# Patient Record
Sex: Male | Born: 1935 | Race: White | Hispanic: No | Marital: Married | Smoking: Never smoker
Health system: Southern US, Community
[De-identification: ages and names within clinical notes are randomized; demographics above are authoritative.]

## PROBLEM LIST (undated history)

## (undated) DIAGNOSIS — I219 Acute myocardial infarction, unspecified: Secondary | ICD-10-CM

## (undated) DIAGNOSIS — R609 Edema, unspecified: Secondary | ICD-10-CM

## (undated) DIAGNOSIS — R011 Cardiac murmur, unspecified: Secondary | ICD-10-CM

## (undated) DIAGNOSIS — C801 Malignant (primary) neoplasm, unspecified: Secondary | ICD-10-CM

## (undated) DIAGNOSIS — I2510 Atherosclerotic heart disease of native coronary artery without angina pectoris: Secondary | ICD-10-CM

## (undated) DIAGNOSIS — I639 Cerebral infarction, unspecified: Secondary | ICD-10-CM

## (undated) DIAGNOSIS — I499 Cardiac arrhythmia, unspecified: Secondary | ICD-10-CM

## (undated) DIAGNOSIS — I4891 Unspecified atrial fibrillation: Secondary | ICD-10-CM

## (undated) DIAGNOSIS — I10 Essential (primary) hypertension: Secondary | ICD-10-CM

## (undated) HISTORY — PX: CORONARY ANGIOPLASTY: SHX604

## (undated) HISTORY — PX: COLON SURGERY: SHX602

---

## 2003-06-02 ENCOUNTER — Other Ambulatory Visit: Payer: Self-pay

## 2006-07-08 ENCOUNTER — Ambulatory Visit: Payer: Self-pay | Admitting: Unknown Physician Specialty

## 2008-05-09 ENCOUNTER — Inpatient Hospital Stay: Payer: Self-pay | Admitting: Internal Medicine

## 2008-05-16 ENCOUNTER — Ambulatory Visit: Payer: Self-pay | Admitting: Internal Medicine

## 2008-06-09 ENCOUNTER — Ambulatory Visit: Payer: Self-pay | Admitting: Internal Medicine

## 2008-06-16 ENCOUNTER — Ambulatory Visit: Payer: Self-pay | Admitting: Internal Medicine

## 2008-07-05 ENCOUNTER — Ambulatory Visit: Payer: Self-pay | Admitting: Surgery

## 2008-07-16 ENCOUNTER — Ambulatory Visit: Payer: Self-pay | Admitting: Internal Medicine

## 2008-08-16 ENCOUNTER — Ambulatory Visit: Payer: Self-pay | Admitting: Internal Medicine

## 2008-09-01 ENCOUNTER — Ambulatory Visit: Payer: Self-pay | Admitting: Surgery

## 2008-09-06 ENCOUNTER — Ambulatory Visit: Payer: Self-pay | Admitting: Surgery

## 2008-09-15 ENCOUNTER — Ambulatory Visit: Payer: Self-pay | Admitting: Internal Medicine

## 2008-10-16 ENCOUNTER — Ambulatory Visit: Payer: Self-pay | Admitting: Internal Medicine

## 2008-11-16 ENCOUNTER — Ambulatory Visit: Payer: Self-pay | Admitting: Internal Medicine

## 2008-12-16 ENCOUNTER — Ambulatory Visit: Payer: Self-pay | Admitting: Internal Medicine

## 2008-12-30 ENCOUNTER — Ambulatory Visit: Payer: Self-pay | Admitting: Surgery

## 2009-01-16 ENCOUNTER — Ambulatory Visit: Payer: Self-pay | Admitting: Internal Medicine

## 2009-02-15 ENCOUNTER — Ambulatory Visit: Payer: Self-pay | Admitting: Internal Medicine

## 2009-03-18 ENCOUNTER — Ambulatory Visit: Payer: Self-pay | Admitting: Internal Medicine

## 2009-04-18 ENCOUNTER — Ambulatory Visit: Payer: Self-pay | Admitting: Internal Medicine

## 2009-05-16 ENCOUNTER — Ambulatory Visit: Payer: Self-pay | Admitting: Internal Medicine

## 2009-06-13 ENCOUNTER — Ambulatory Visit: Payer: Self-pay | Admitting: Internal Medicine

## 2009-06-16 ENCOUNTER — Ambulatory Visit: Payer: Self-pay | Admitting: Internal Medicine

## 2009-07-16 ENCOUNTER — Ambulatory Visit: Payer: Self-pay | Admitting: Internal Medicine

## 2009-08-16 ENCOUNTER — Ambulatory Visit: Payer: Self-pay | Admitting: Internal Medicine

## 2009-09-15 ENCOUNTER — Ambulatory Visit: Payer: Self-pay | Admitting: Internal Medicine

## 2009-11-13 ENCOUNTER — Ambulatory Visit: Payer: Self-pay | Admitting: Internal Medicine

## 2009-11-16 ENCOUNTER — Ambulatory Visit: Payer: Self-pay | Admitting: Internal Medicine

## 2009-12-16 ENCOUNTER — Ambulatory Visit: Payer: Self-pay | Admitting: Internal Medicine

## 2009-12-18 ENCOUNTER — Ambulatory Visit: Payer: Self-pay | Admitting: Internal Medicine

## 2009-12-19 LAB — CEA: CEA: 2.5 ng/mL (ref 0.0–4.7)

## 2010-01-16 ENCOUNTER — Ambulatory Visit: Payer: Self-pay | Admitting: Internal Medicine

## 2010-02-15 ENCOUNTER — Ambulatory Visit: Payer: Self-pay | Admitting: Internal Medicine

## 2010-03-14 ENCOUNTER — Ambulatory Visit: Payer: Self-pay | Admitting: Internal Medicine

## 2010-03-16 LAB — CEA: CEA: 2.3 ng/mL (ref 0.0–4.7)

## 2010-03-18 ENCOUNTER — Ambulatory Visit: Payer: Self-pay | Admitting: Internal Medicine

## 2010-04-18 ENCOUNTER — Ambulatory Visit: Payer: Self-pay | Admitting: Internal Medicine

## 2010-05-17 ENCOUNTER — Ambulatory Visit: Payer: Self-pay | Admitting: Internal Medicine

## 2010-06-12 LAB — CEA: CEA: 3.1 ng/mL (ref 0.0–4.7)

## 2010-06-17 ENCOUNTER — Ambulatory Visit: Payer: Self-pay | Admitting: Internal Medicine

## 2010-09-10 ENCOUNTER — Ambulatory Visit: Payer: Self-pay | Admitting: Internal Medicine

## 2010-09-16 ENCOUNTER — Ambulatory Visit: Payer: Self-pay | Admitting: Internal Medicine

## 2011-01-11 ENCOUNTER — Ambulatory Visit: Payer: Self-pay | Admitting: Surgery

## 2011-01-14 ENCOUNTER — Ambulatory Visit: Payer: Self-pay | Admitting: Internal Medicine

## 2011-01-15 LAB — CEA: CEA: 2.9 ng/mL (ref 0.0–4.7)

## 2011-01-17 ENCOUNTER — Ambulatory Visit: Payer: Self-pay | Admitting: Internal Medicine

## 2011-05-16 ENCOUNTER — Ambulatory Visit: Payer: Self-pay | Admitting: Internal Medicine

## 2011-05-16 LAB — CREATININE, SERUM: EGFR (African American): >60

## 2011-05-17 ENCOUNTER — Ambulatory Visit: Payer: Self-pay | Admitting: Internal Medicine

## 2011-06-17 ENCOUNTER — Ambulatory Visit: Payer: Self-pay | Admitting: Internal Medicine

## 2012-07-16 DIAGNOSIS — I639 Cerebral infarction, unspecified: Secondary | ICD-10-CM | POA: Insufficient documentation

## 2012-08-03 ENCOUNTER — Inpatient Hospital Stay: Payer: Self-pay | Admitting: Cardiology

## 2012-08-03 LAB — PROTIME-INR
INR: 1.1
Prothrombin Time: 14.3 secs (ref 11.5–14.7)

## 2012-08-03 LAB — COMPREHENSIVE METABOLIC PANEL
Albumin: 3.5 g/dL (ref 3.4–5.0)
Anion Gap: 3 — ABNORMAL LOW (ref 7–16)
Bilirubin,Total: 0.6 mg/dL (ref 0.2–1.0)
Chloride: 107 mmol/L (ref 98–107)
Co2: 29 mmol/L (ref 21–32)
Creatinine: 0.84 mg/dL (ref 0.60–1.30)
EGFR (African American): >60
EGFR (Non-African Amer.): >60
Glucose: 77 mg/dL (ref 65–99)
Osmolality: 276 (ref 275–301)
SGOT(AST): 25 U/L (ref 15–37)
Sodium: 139 mmol/L (ref 136–145)
Total Protein: 7 g/dL (ref 6.4–8.2)

## 2012-08-03 LAB — CBC WITH DIFFERENTIAL/PLATELET
Basophil #: 0 10*3/uL (ref 0.0–0.1)
Basophil %: 0.6 %
Eosinophil #: 0.1 10*3/uL (ref 0.0–0.7)
Eosinophil %: 3.3 %
Lymphocyte %: 21.5 %
MCH: 34.3 pg — ABNORMAL HIGH (ref 26.0–34.0)
MCHC: 34.4 g/dL (ref 32.0–36.0)
Monocyte #: 0.6 x10 3/mm (ref 0.2–1.0)
Monocyte %: 12.9 %
Neutrophil #: 2.7 10*3/uL (ref 1.4–6.5)
Platelet: 110 10*3/uL — ABNORMAL LOW (ref 150–440)
WBC: 4.4 10*3/uL (ref 3.8–10.6)

## 2012-08-03 LAB — APTT: Activated PTT: 30.8 secs (ref 23.6–35.9)

## 2012-08-04 LAB — LIPID PANEL
Cholesterol: 162 mg/dL (ref 0–200)
HDL Cholesterol: 70 mg/dL — ABNORMAL HIGH (ref 40–60)
Ldl Cholesterol, Calc: 80 mg/dL (ref 0–100)
Triglycerides: 59 mg/dL (ref 0–200)
VLDL Cholesterol, Calc: 12 mg/dL (ref 5–40)

## 2012-08-04 LAB — BASIC METABOLIC PANEL
Calcium, Total: 8.2 mg/dL — ABNORMAL LOW (ref 8.5–10.1)
Chloride: 106 mmol/L (ref 98–107)
EGFR (African American): >60

## 2012-08-04 LAB — APTT: Activated PTT: 106.9 secs — ABNORMAL HIGH (ref 23.6–35.9)

## 2012-08-04 LAB — HEMOGLOBIN A1C: Hemoglobin A1C: 6 % (ref 4.2–6.3)

## 2012-08-05 LAB — CBC WITH DIFFERENTIAL/PLATELET
Basophil %: 0.8 %
Eosinophil #: 0.3 10*3/uL (ref 0.0–0.7)
Eosinophil %: 7.2 %
HCT: 39.1 % — ABNORMAL LOW (ref 40.0–52.0)
HGB: 13.5 g/dL (ref 13.0–18.0)
Lymphocyte #: 1.7 10*3/uL (ref 1.0–3.6)
Lymphocyte %: 36.1 %
MCH: 34.2 pg — ABNORMAL HIGH (ref 26.0–34.0)
MCHC: 34.5 g/dL (ref 32.0–36.0)
MCV: 99 fL (ref 80–100)
Monocyte #: 0.6 x10 3/mm (ref 0.2–1.0)
Monocyte %: 13.2 %
Neutrophil #: 2 10*3/uL (ref 1.4–6.5)
Neutrophil %: 42.7 %
RBC: 3.94 10*6/uL — ABNORMAL LOW (ref 4.40–5.90)
RDW: 13 % (ref 11.5–14.5)
WBC: 4.6 10*3/uL (ref 3.8–10.6)

## 2013-12-10 DIAGNOSIS — I4891 Unspecified atrial fibrillation: Secondary | ICD-10-CM | POA: Insufficient documentation

## 2013-12-10 DIAGNOSIS — E785 Hyperlipidemia, unspecified: Secondary | ICD-10-CM | POA: Insufficient documentation

## 2014-07-08 NOTE — Consult Note (Signed)
Referring Physician:  Teodoro Spray   Primary Care Physician:  Sharol Given, [address] [place], [place] [address], [phone]  Reason for Consult: Admit Date: 03-Aug-2012  Chief Complaint: altered mental status  Reason for Consult: CVA   History of Present Illness: History of Present Illness:   81 yo RHD M presents 2 days ago with altered mental status.  Pt was seen by cardiology and directly admitted.  Per family, pt had problems balancing his checkbook and saying certain words.  He feels better now.   ROS:  General denies complaints   HEENT no complaints   Lungs no complaints   Cardiac no complaints   GI no complaints   GU no complaints   Musculoskeletal no complaints   Extremities no complaints   Skin no complaints   Neuro no complaints   Endocrine no complaints   Psych no complaints   Past Medical/Surgical Hx:  colon ca:   prostate stones:   Past Medical/ Surgical Hx:  Past Medical History colon CA, afib   Past Surgical History none   Home Medications: Medication Instructions Last Modified Date/Time  aspirin 81 mg oral tablet 1 tab(s) orally once a day 19-May-14 10:45   Allergies:  No Known Allergies:   Allergies:  Allergies NKDA   Social/Family History: Employment Status: retired  Lives With: spouse  Living Arrangements: apartment  Social History: no tob, no illicits, no EtOH, retired Chief Financial Officer  Family History: no stroke or CAD   Vital Signs: **Vital Signs.:   20-May-14 15:18  Vital Signs Type Routine  Temperature Temperature (F) 97.9  Celsius 36.6  Temperature Source oral  Pulse Pulse 80  Respirations Respirations 20  Systolic BP Systolic BP 829  Diastolic BP (mmHg) Diastolic BP (mmHg) 77  Mean BP 89  Pulse Ox % Pulse Ox % 93  Pulse Ox Activity Level  At rest  Oxygen Delivery Room Air/ 21 %   Physical Exam: General: NAD, resting comfortable, appropiate weight  HEENT: normocephalic,  sclera nonicteric, oropharynx clear  Neck: supple, no JVD, no bruits  Chest: CTA B, no wheezing, good movement  Cardiac: irregularrly irregular, no murmur, +2 pulses  Extremities: no C/C/E, FROM   Neurologic Exam: Mental Status: alert and oriented x 3, normal speech and language, follows complex commands;  mild finger agnosia and acalculia  Cranial Nerves: PERRLA, EOMI, nl VF, face symmetric, tongue midline, shoulder shrug equal  Motor Exam: 5/5 B normal, tone, no tremor  Deep Tendon Reflexes: 2+/4 B, plantars downgoing B, no Hoffman  Sensory Exam: pinprick, temperature, and vibration intact B  Coordination: FTN and HTS WNL, nl RAM, nl gait   Lab Results: LabObservation:  20-May-14 15:12   OBSERVATION Reason for Test  Hepatic:  19-May-14 10:48   Bilirubin, Total 0.6  Alkaline Phosphatase 78  SGPT (ALT) 23  SGOT (AST) 25  Total Protein, Serum 7.0  Albumin, Serum 3.5  Routine Chem:  19-May-14 21:23   Result Comment Aptt Levester Fresh at 2154 08-03-12 JB.  - NOTIFIED OF CRITICAL VALUE  - READ-BACK PROCESS PERFORMED.  - RESULTS VERIFIED BY REPEAT TESTING.  Result(s) reported on 03 Aug 2012 at 09:56PM.  20-May-14 05:20   Glucose, Serum 91  BUN 14  Creatinine (comp) 0.94  Sodium, Serum 139  Potassium, Serum 4.2  Chloride, Serum 106  CO2, Serum 25  Calcium (Total), Serum  8.2  Anion Gap 8  Osmolality (calc) 278  eGFR (African American) >60  eGFR (  Non-African American) >60 (eGFR values <73m/min/1.73 m2 may be an indication of chronic kidney disease (CKD). Calculated eGFR is useful in patients with stable renal function. The eGFR calculation will not be reliable in acutely ill patients when serum creatinine is changing rapidly. It is not useful in  patients on dialysis. The eGFR calculation may not be applicable to patients at the low and high extremes of body sizes, pregnant women, and vegetarians.)  Cardiac:  19-May-14 10:48   Troponin I < 0.02 (0.00-0.05 0.05 ng/mL  or less: NEGATIVE  Repeat testing in 3-6 hrs  if clinically indicated. >0.05 ng/mL: POTENTIAL  MYOCARDIAL INJURY. Repeat  testing in 3-6 hrs if  clinically indicated. NOTE: An increase or decrease  of 30% or more on serial  testing suggests a  clinically important change)  Routine Coag:  19-May-14 10:48   Prothrombin 14.3  INR 1.1 (INR reference interval applies to patients on anticoagulant therapy. A single INR therapeutic range for coumarins is not optimal for all indications; however, the suggested range for most indications is 2.0 - 3.0. Exceptions to the INR Reference Range may include: Prosthetic heart valves, acute myocardial infarction, prevention of myocardial infarction, and combinations of aspirin and anticoagulant. The need for a higher or lower target INR must be assessed individually. Reference: The Pharmacology and Management of the Vitamin K  antagonists: the seventh ACCP Conference on Antithrombotic and Thrombolytic Therapy. CEHMCN.4709Sept:126 (3suppl): [id number] A HCT value >55% may artifactually increase the PT.  In one study,  the increase was an average of 25%. Reference:  "Effect on Routine and Special Coagulation Testing Values of Citrate Anticoagulant Adjustment in Patients with High HCT Values." American Journal of Clinical Pathology 2006;126:400-405.)  20-May-14 05:20   Activated PTT (APTT)  106.9 (A HCT value >55% may artifactually increase the APTT. In one study, the increase was an average of 19%. Reference: "Effect on Routine and Special Coagulation Testing Values of Citrate Anticoagulant Adjustment in Patients with High HCT Values." American Journal of Clinical Pathology 2006;126:400-405.)  Routine Hem:  19-May-14 10:48   WBC (CBC) 4.4  RBC (CBC)  4.32  Hemoglobin (CBC) 14.8  Hematocrit (CBC) 43.1  Platelet Count (CBC)  110  MCV 100  MCH  34.3  MCHC 34.4  RDW 13.2  Neutrophil % 61.7  Lymphocyte % 21.5  Monocyte % 12.9  Eosinophil %  3.3  Basophil % 0.6  Neutrophil # 2.7  Lymphocyte #  0.9  Monocyte # 0.6  Eosinophil # 0.1  Basophil # 0.0 (Result(s) reported on 03 Aug 2012 at 11:24AM.)   Radiology Results: [place]    19-May-14 11:55, [place] Doppler Bilateral  [place] Doppler Bilateral   REASON FOR EXAM:    tia  COMMENTS:       PROCEDURE: [place] - [place]OTID DOPPLER BILATERAL  - Aug 03 2012 11:55AM     RESULT: Comparison: None    Technique: Gray-scale, color Doppler, and spectral Doppler images were   obtained of the extracranial carotid artery systems and vertebral   arteries in the neck.    Findings:  Mild atherosclerotic plaque is demonstrated in the carotid bulbs and   internal carotid arteries. By visual inspection, there is less than 50%   stenosis. The peak systolic velocities are not elevated.  The vertebral arteries are patent bilaterally.    IMPRESSION:    No evidence of hemodynamically significant stenosis in the extracranial   carotid arteries.    Dictation site: 2  Verified By: Gregor Hams, M.D., MD  MRI:    20-May-14 14:10, MRI Brain Without Contrast  MRI Brain Without Contrast   REASON FOR EXAM:    tia  COMMENTS:       PROCEDURE: MR  - MR BRAIN WO CONTRAST  - Aug 04 2012  2:10PM     RESULT: History: TIA    Technique: Multiplanar, multisequence MRI of the brain was obtained   without IV contrast.     Comparison:  None    Findings:     There is restricted diffusion in the left parietal lobe with     corresponding ADC hypointensity consistent with an acute infarct. There   is no hemorrhage. There is no pathologic extra-axial fluid collection.   There is generalized cerebralatrophy. There is periventricular and deep   white matter T2 and FLAIR hyperintensity likely secondary to   microangiopathy. There is no hydrocephalus. The ventricles are normal for   age. The basal cisterns are patent. There are no abnormal extra-axial   fluid collections.     The visualized  paranasal sinuses and mastoid sinuses are clear. The skull   base and calvarium demonstrate normal signal. The major intracranial flow   voids, including dural venous sinuses appear patent.    IMPRESSION:     Acute nonhemorrhagic left parietal lobe infarct.  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 which explains problems with producing words and acalculia;  this is most likely secondary to atrial fibrillation Atrial fibrillation-  rate controlled ok to continue heparin since this is a small stroke would initiate either coumadin with goal INR 2-3 or newer anticoagulant tomorrow needs continued speech therapy no PT/OT needs carotids pending but most likely not source checking lipids, TSH, Hem A1, B12 will follow  Electronic Signatures: Jamison Neighbor (MD)  (Signed 20-May-14 18:44)  Authored: REFERRING PHYSICIAN, Primary Care Physician, Consult, History of Present Illness, Review of Systems, PAST MEDICAL/SURGICAL HISTORY, HOME MEDICATIONS, ALLERGIES, Social/Family History, NURSING VITAL SIGNS, Physical Exam-, LAB RESULTS, RADIOLOGY RESULTS, Recommendations   Last Updated: 20-May-14 18:44 by Jamison Neighbor (MD)

## 2014-07-08 NOTE — Discharge Summary (Signed)
Dates of Admission and Diagnosis:  Date of Admission 03-Aug-2012   Date of Discharge 05-Aug-2012   Admitting Diagnosis tia   Final Diagnosis left parietal cva   Discharge Diagnosis 1 left parietal cva   2 atrial fibrillation    Chief Complaint/History of Present Illness Pt presented to our office with complaints of speech difficulty as well as difficulty balancing his check book. He was non focal. He has been in afib chronically but had deferred anticoagulation or antiplatelet therapy.   Hospital Course:  Hospital Course Admitted and initially placed on heparin therapy. He underwent head ct and carotid dopplers both of which were unremarkable for any abnormality. He had a normal troponin. EKG revealed afib with variable ventricular response. MRI of brain revealed left parietal cva. Neurology consult completed with recommendation to add anticoagulation therapy after discharge. Will add riveroxiban in 1 week. Continue with low dose asa and metoprolol for afib rate control. No evidence of atherosclerosis. LDL 80. Likely etiology was from  afib.   Condition on Discharge Good   DISCHARGE INSTRUCTIONS HOME MEDS:  Medication Reconciliation: Patient's Home Medications at Discharge:     Medication Instructions  aspirin 81 mg oral tablet  1 tab(s) orally once a day   metoprolol succinate 25 mg oral tablet, extended release  1 tab(s) orally once a day    PRESCRIPTIONS: PRINTED AND PLACED ON CHART   Physician's Instructions:  Home Health? No   Treatments None   Home Oxygen? No   Diet Low Fat, Low Cholesterol   Dietary Supplements None   Diet Consistency Regular Consistency   Activity Limitations None   Return to Work Not Applicable   Time frame for Follow Up Appointment 1-2 weeks  Oaklen Thiam   Electronic Signatures: Teodoro Spray (MD)  (Signed 21-May-14 08:16)  Authored: ADMISSION DATE AND DIAGNOSIS, CHIEF COMPLAINT/HPI, HOSPITAL COURSE, DISCHARGE INSTRUCTIONS HOME MEDS,  PATIENT INSTRUCTIONS   Last Updated: 21-May-14 08:16 by Teodoro Spray (MD)

## 2014-08-06 ENCOUNTER — Other Ambulatory Visit (HOSPITAL_COMMUNITY): Payer: Self-pay

## 2014-08-06 ENCOUNTER — Inpatient Hospital Stay (HOSPITAL_COMMUNITY)
Admission: EM | Admit: 2014-08-06 | Discharge: 2014-08-09 | DRG: 247 | Disposition: A | Discharge status: Home / Self Care | Payer: Medicare Other | Attending: Cardiology | Admitting: Cardiology

## 2014-08-06 ENCOUNTER — Ambulatory Visit (HOSPITAL_COMMUNITY): Admit: 2014-08-06 | Payer: Self-pay | Admitting: Cardiology

## 2014-08-06 ENCOUNTER — Encounter (HOSPITAL_COMMUNITY): Payer: Self-pay | Admitting: Emergency Medicine

## 2014-08-06 ENCOUNTER — Encounter (HOSPITAL_COMMUNITY)
Admission: EM | Disposition: A | Discharge status: Home / Self Care | Payer: Self-pay | Source: Home / Self Care | Attending: Cardiology

## 2014-08-06 DIAGNOSIS — R0789 Other chest pain: Secondary | ICD-10-CM | POA: Diagnosis present

## 2014-08-06 DIAGNOSIS — I10 Essential (primary) hypertension: Secondary | ICD-10-CM | POA: Diagnosis present

## 2014-08-06 DIAGNOSIS — I482 Chronic atrial fibrillation: Secondary | ICD-10-CM | POA: Diagnosis present

## 2014-08-06 DIAGNOSIS — I2510 Atherosclerotic heart disease of native coronary artery without angina pectoris: Secondary | ICD-10-CM | POA: Diagnosis present

## 2014-08-06 DIAGNOSIS — I2111 ST elevation (STEMI) myocardial infarction involving right coronary artery: Secondary | ICD-10-CM | POA: Diagnosis present

## 2014-08-06 DIAGNOSIS — I2119 ST elevation (STEMI) myocardial infarction involving other coronary artery of inferior wall: Secondary | ICD-10-CM | POA: Diagnosis present

## 2014-08-06 DIAGNOSIS — E785 Hyperlipidemia, unspecified: Secondary | ICD-10-CM | POA: Diagnosis present

## 2014-08-06 DIAGNOSIS — I252 Old myocardial infarction: Secondary | ICD-10-CM

## 2014-08-06 DIAGNOSIS — D696 Thrombocytopenia, unspecified: Secondary | ICD-10-CM | POA: Diagnosis present

## 2014-08-06 DIAGNOSIS — Z8673 Personal history of transient ischemic attack (TIA), and cerebral infarction without residual deficits: Secondary | ICD-10-CM

## 2014-08-06 DIAGNOSIS — I213 ST elevation (STEMI) myocardial infarction of unspecified site: Secondary | ICD-10-CM

## 2014-08-06 HISTORY — DX: Essential (primary) hypertension: I10

## 2014-08-06 HISTORY — DX: Malignant (primary) neoplasm, unspecified: C80.1

## 2014-08-06 HISTORY — DX: Cerebral infarction, unspecified: I63.9

## 2014-08-06 HISTORY — PX: CARDIAC CATHETERIZATION: SHX172

## 2014-08-06 HISTORY — DX: Atherosclerotic heart disease of native coronary artery without angina pectoris: I25.10

## 2014-08-06 HISTORY — DX: Cardiac arrhythmia, unspecified: I49.9

## 2014-08-06 LAB — CBC WITH DIFFERENTIAL/PLATELET
BASOS PCT: 0 % (ref 0–1)
Basophils Absolute: 0 10*3/uL (ref 0.0–0.1)
EOS ABS: 0.1 10*3/uL (ref 0.0–0.7)
EOS PCT: 1 % (ref 0–5)
HEMATOCRIT: 43.1 % (ref 39.0–52.0)
HEMOGLOBIN: 14.5 g/dL (ref 13.0–17.0)
Lymphocytes Relative: 16 % (ref 12–46)
Lymphs Abs: 0.9 10*3/uL (ref 0.7–4.0)
MCH: 33.6 pg (ref 26.0–34.0)
MCHC: 33.6 g/dL (ref 30.0–36.0)
MCV: 100 fL (ref 78.0–100.0)
Monocytes Absolute: 0.4 10*3/uL (ref 0.1–1.0)
Monocytes Relative: 8 % (ref 3–12)
NEUTROS PCT: 76 % (ref 43–77)
Neutro Abs: 4.5 10*3/uL (ref 1.7–7.7)
PLATELETS: 103 10*3/uL — AB (ref 150–400)
RBC: 4.31 MIL/uL (ref 4.22–5.81)
RDW: 12.9 % (ref 11.5–15.5)
WBC: 5.9 10*3/uL (ref 4.0–10.5)

## 2014-08-06 LAB — TROPONIN I
TROPONIN I: 4.81 ng/mL — AB (ref <0.031)
TROPONIN I: 22.71 ng/mL — AB (ref <0.031)
Troponin I: 25.18 ng/mL (ref <0.031)

## 2014-08-06 LAB — CBC
HCT: 42.4 % (ref 39.0–52.0)
HEMOGLOBIN: 14.3 g/dL (ref 13.0–17.0)
MCH: 33.9 pg (ref 26.0–34.0)
MCHC: 33.7 g/dL (ref 30.0–36.0)
MCV: 100.5 fL — ABNORMAL HIGH (ref 78.0–100.0)
Platelets: 102 10*3/uL — ABNORMAL LOW (ref 150–400)
RBC: 4.22 MIL/uL (ref 4.22–5.81)
RDW: 12.9 % (ref 11.5–15.5)
WBC: 5.9 10*3/uL (ref 4.0–10.5)

## 2014-08-06 LAB — COMPREHENSIVE METABOLIC PANEL
ALBUMIN: 3.5 g/dL (ref 3.5–5.0)
ALT: 16 U/L — ABNORMAL LOW (ref 17–63)
ALT: 20 U/L (ref 17–63)
ANION GAP: 11 (ref 5–15)
ANION GAP: 9 (ref 5–15)
AST: 25 U/L (ref 15–41)
AST: 64 U/L — ABNORMAL HIGH (ref 15–41)
Albumin: 3.4 g/dL — ABNORMAL LOW (ref 3.5–5.0)
Alkaline Phosphatase: 60 U/L (ref 38–126)
Alkaline Phosphatase: 62 U/L (ref 38–126)
BUN: 11 mg/dL (ref 6–20)
BUN: 10 mg/dL (ref 6–20)
CALCIUM: 8.7 mg/dL — AB (ref 8.9–10.3)
CHLORIDE: 105 mmol/L (ref 101–111)
CO2: 23 mmol/L (ref 22–32)
CO2: 26 mmol/L (ref 22–32)
CREATININE: 0.86 mg/dL (ref 0.61–1.24)
Calcium: 8.6 mg/dL — ABNORMAL LOW (ref 8.9–10.3)
Chloride: 104 mmol/L (ref 101–111)
Creatinine, Ser: 0.85 mg/dL (ref 0.61–1.24)
GFR calc Af Amer: >60 mL/min (ref >60)
GFR calc Af Amer: >60 mL/min (ref >60)
GFR calc non Af Amer: >60 mL/min (ref >60)
Glucose, Bld: 124 mg/dL — ABNORMAL HIGH (ref 65–99)
Glucose, Bld: 114 mg/dL — ABNORMAL HIGH (ref 65–99)
Potassium: 3.8 mmol/L (ref 3.5–5.1)
Potassium: 4.4 mmol/L (ref 3.5–5.1)
SODIUM: 139 mmol/L (ref 135–145)
Sodium: 139 mmol/L (ref 135–145)
Total Bilirubin: 0.9 mg/dL (ref 0.3–1.2)
Total Bilirubin: 0.8 mg/dL (ref 0.3–1.2)
Total Protein: 6.1 g/dL — ABNORMAL LOW (ref 6.5–8.1)
Total Protein: 6.3 g/dL — ABNORMAL LOW (ref 6.5–8.1)

## 2014-08-06 LAB — PROTIME-INR
INR: 2.2 — ABNORMAL HIGH (ref 0.00–1.49)
Prothrombin Time: 24.2 seconds — ABNORMAL HIGH (ref 11.6–15.2)

## 2014-08-06 LAB — POCT I-STAT TROPONIN I: TROPONIN I, POC: 0 ng/mL (ref 0.00–0.08)

## 2014-08-06 LAB — APTT

## 2014-08-06 LAB — MRSA PCR SCREENING: MRSA BY PCR: NEGATIVE

## 2014-08-06 LAB — POCT ACTIVATED CLOTTING TIME: ACTIVATED CLOTTING TIME: 171 s

## 2014-08-06 SURGERY — LEFT HEART CATH AND CORONARY ANGIOGRAPHY
Anesthesia: LOCAL

## 2014-08-06 MED ORDER — TAMSULOSIN HCL 0.4 MG PO CAPS
0.4000 mg | ORAL_CAPSULE | Freq: Every day | ORAL | Status: DC
  Administered 2014-08-06 – 2014-08-09 (×4): 0.4 mg via ORAL
  Filled 2014-08-06 (×4): qty 1

## 2014-08-06 MED ORDER — BIVALIRUDIN BOLUS VIA INFUSION - CUPID
INTRAVENOUS | Status: DC | PRN
  Administered 2014-08-06: 55.125 mg via INTRAVENOUS

## 2014-08-06 MED ORDER — ATORVASTATIN CALCIUM 40 MG PO TABS
40.0000 mg | ORAL_TABLET | Freq: Every day | ORAL | Status: DC
  Administered 2014-08-06 – 2014-08-08 (×3): 40 mg via ORAL
  Filled 2014-08-06 (×4): qty 1

## 2014-08-06 MED ORDER — NITROGLYCERIN IN D5W 200-5 MCG/ML-% IV SOLN: 3.0000 ug/min | INTRAVENOUS | Status: DC

## 2014-08-06 MED ORDER — NITROGLYCERIN 1 MG/10 ML FOR IR/CATH LAB
INTRA_ARTERIAL | Status: AC
  Filled 2014-08-06: qty 10

## 2014-08-06 MED ORDER — ATROPINE SULFATE 0.1 MG/ML IJ SOLN
INTRAMUSCULAR | Status: DC | PRN
  Administered 2014-08-06 (×2): 0.5 mg via INTRAVENOUS

## 2014-08-06 MED ORDER — ONDANSETRON HCL 4 MG/2ML IJ SOLN
4.0000 mg | Freq: Four times a day (QID) | INTRAMUSCULAR | Status: DC | PRN
  Administered 2014-08-06 – 2014-08-08 (×3): 4 mg via INTRAVENOUS
  Filled 2014-08-06 (×3): qty 2

## 2014-08-06 MED ORDER — LIDOCAINE HCL (PF) 1 % IJ SOLN
INTRAMUSCULAR | Status: AC
  Filled 2014-08-06: qty 30

## 2014-08-06 MED ORDER — ACETAMINOPHEN 325 MG PO TABS: 650.0000 mg | ORAL_TABLET | ORAL | Status: DC | PRN

## 2014-08-06 MED ORDER — METOPROLOL TARTRATE 12.5 MG HALF TABLET
12.5000 mg | ORAL_TABLET | Freq: Two times a day (BID) | ORAL | Status: DC
  Administered 2014-08-06 – 2014-08-08 (×5): 12.5 mg via ORAL
  Filled 2014-08-06 (×6): qty 1

## 2014-08-06 MED ORDER — BIVALIRUDIN 250 MG IV SOLR
INTRAVENOUS | Status: AC
  Filled 2014-08-06: qty 250

## 2014-08-06 MED ORDER — HEPARIN SODIUM (PORCINE) 5000 UNIT/ML IJ SOLN
4000.0000 [IU] | INTRAMUSCULAR | Status: AC
  Administered 2014-08-06: 4000 [IU] via INTRAVENOUS
  Filled 2014-08-06: qty 1

## 2014-08-06 MED ORDER — ASPIRIN EC 81 MG PO TBEC
81.0000 mg | DELAYED_RELEASE_TABLET | Freq: Every day | ORAL | Status: DC
  Administered 2014-08-07 – 2014-08-09 (×3): 81 mg via ORAL
  Filled 2014-08-06 (×3): qty 1

## 2014-08-06 MED ORDER — TICAGRELOR 90 MG PO TABS
180.0000 mg | ORAL_TABLET | Freq: Once | ORAL | Status: AC
  Administered 2014-08-06: 180 mg via ORAL
  Filled 2014-08-06: qty 2

## 2014-08-06 MED ORDER — METOPROLOL TARTRATE 1 MG/ML IV SOLN
INTRAVENOUS | Status: AC
  Filled 2014-08-06: qty 5

## 2014-08-06 MED ORDER — ANGIOPLASTY BOOK
Freq: Once | Status: DC
  Filled 2014-08-06: qty 1

## 2014-08-06 MED ORDER — ACETAMINOPHEN 325 MG PO TABS
650.0000 mg | ORAL_TABLET | ORAL | Status: DC | PRN
  Administered 2014-08-06: 650 mg via ORAL
  Filled 2014-08-06: qty 2

## 2014-08-06 MED ORDER — SODIUM CHLORIDE 0.9 % IJ SOLN
3.0000 mL | Freq: Two times a day (BID) | INTRAMUSCULAR | Status: DC
  Administered 2014-08-06 (×2): 3 mL via INTRAVENOUS
  Administered 2014-08-07: 21:00:00 via INTRAVENOUS
  Administered 2014-08-07 – 2014-08-09 (×4): 3 mL via INTRAVENOUS

## 2014-08-06 MED ORDER — SODIUM CHLORIDE 0.9 % IV SOLN
250.0000 mg | INTRAVENOUS | Status: DC | PRN
  Administered 2014-08-06: 1.75 mg/kg/h via INTRAVENOUS

## 2014-08-06 MED ORDER — IOHEXOL 350 MG/ML SOLN
INTRAVENOUS | Status: DC | PRN
  Administered 2014-08-06: 115 mL via INTRAVENOUS

## 2014-08-06 MED ORDER — SODIUM CHLORIDE 0.9 % IJ SOLN: 3.0000 mL | INTRAMUSCULAR | Status: DC | PRN

## 2014-08-06 MED ORDER — ONDANSETRON HCL 4 MG/2ML IJ SOLN: 4.0000 mg | Freq: Four times a day (QID) | INTRAMUSCULAR | Status: DC | PRN

## 2014-08-06 MED ORDER — NITROGLYCERIN 0.4 MG SL SUBL: 0.4000 mg | SUBLINGUAL_TABLET | SUBLINGUAL | Status: DC | PRN

## 2014-08-06 MED ORDER — SODIUM CHLORIDE 0.9 % IV SOLN
INTRAVENOUS | Status: DC
  Administered 2014-08-06: 07:00:00 via INTRAVENOUS

## 2014-08-06 MED ORDER — SODIUM CHLORIDE 0.9 % IV SOLN: 250.0000 mL | INTRAVENOUS | Status: DC | PRN

## 2014-08-06 MED ORDER — ASPIRIN 81 MG PO CHEW: 324.0000 mg | CHEWABLE_TABLET | ORAL | Status: DC

## 2014-08-06 MED ORDER — TICAGRELOR 90 MG PO TABS
90.0000 mg | ORAL_TABLET | Freq: Two times a day (BID) | ORAL | Status: DC
  Filled 2014-08-06 (×2): qty 1

## 2014-08-06 MED ORDER — METOPROLOL TARTRATE 1 MG/ML IV SOLN
INTRAVENOUS | Status: DC | PRN
Start: 2014-08-06 — End: 2014-08-06
  Administered 2014-08-06: 2.5 mg via INTRAVENOUS

## 2014-08-06 MED ORDER — TICAGRELOR 90 MG PO TABS
90.0000 mg | ORAL_TABLET | Freq: Two times a day (BID) | ORAL | Status: DC
  Administered 2014-08-06 – 2014-08-08 (×4): 90 mg via ORAL
  Filled 2014-08-06 (×5): qty 1

## 2014-08-06 MED ORDER — PANTOPRAZOLE SODIUM 40 MG PO TBEC
40.0000 mg | DELAYED_RELEASE_TABLET | Freq: Every day | ORAL | Status: DC
  Administered 2014-08-06 – 2014-08-09 (×4): 40 mg via ORAL
  Filled 2014-08-06 (×3): qty 1

## 2014-08-06 MED ORDER — POTASSIUM CHLORIDE 10 MEQ/50ML IV SOLN: 10.0000 meq | INTRAVENOUS | Status: DC

## 2014-08-06 MED ORDER — HEPARIN (PORCINE) IN NACL 2-0.9 UNIT/ML-% IJ SOLN
INTRAMUSCULAR | Status: AC
  Filled 2014-08-06: qty 1500

## 2014-08-06 MED ORDER — ASPIRIN 300 MG RE SUPP
300.0000 mg | RECTAL | Status: DC
Start: 2014-08-06 — End: 2014-08-06

## 2014-08-06 MED ORDER — ASPIRIN 81 MG PO CHEW: 81.0000 mg | CHEWABLE_TABLET | Freq: Every day | ORAL | Status: DC

## 2014-08-06 MED ORDER — SODIUM CHLORIDE 0.9 % IV SOLN
INTRAVENOUS | Status: AC
  Administered 2014-08-06: 150 mL/h via INTRAVENOUS

## 2014-08-06 MED ORDER — ATROPINE SULFATE 0.1 MG/ML IJ SOLN
INTRAMUSCULAR | Status: AC
  Filled 2014-08-06: qty 10

## 2014-08-06 SURGICAL SUPPLY — 17 items
BALLN EMERGE MR 2.5X15 (BALLOONS) ×2
BALLN ~~LOC~~ EMERGE MR 3.75X15 (BALLOONS) ×2
BALLOON EMERGE MR 2.5X15 (BALLOONS) ×1 IMPLANT
BALLOON ~~LOC~~ EMERGE MR 3.75X15 (BALLOONS) ×1 IMPLANT
CATH INFINITI 5FR MULTPACK ANG (CATHETERS) ×2 IMPLANT
GUIDE CATH RUNWAY 6FR FR4 (CATHETERS) ×2 IMPLANT
KIT ENCORE 26 ADVANTAGE (KITS) ×2 IMPLANT
KIT HEART LEFT (KITS) ×2 IMPLANT
PACK CARDIAC CATHETERIZATION (CUSTOM PROCEDURE TRAY) ×2 IMPLANT
SHEATH PINNACLE 5F 10CM (SHEATH) ×2 IMPLANT
SHEATH PINNACLE 6F 10CM (SHEATH) ×2 IMPLANT
STENT XIENCE ALPINE RX 3.5X23 (Permanent Stent) ×2 IMPLANT
SYR MEDRAD MARK V 150ML (SYRINGE) ×2 IMPLANT
TRANSDUCER W/STOPCOCK (MISCELLANEOUS) ×2 IMPLANT
TUBING CIL FLEX 10 FLL-RA (TUBING) ×2 IMPLANT
WIRE EMERALD 3MM-J .035X150CM (WIRE) ×2 IMPLANT
WIRE PT2 MS 185 (WIRE) ×2 IMPLANT

## 2014-08-06 NOTE — Progress Notes (Addendum)
CRITICAL VALUE ALERT  Critical value received:  TROPONIN I 4.81 and PTT >200   Date of notification:  08/06/14  Time of notification:  1130  Critical value read back:Yes.    Nurse who received alert:  Sula Soda  MD notified (1st page):  Harwani  Time of first page:  21  MD notified (2nd page):Harwani  Time of second page: 1140  Responding MD:  Terrence Dupont  Time MD responded:  [address] [place]

## 2014-08-06 NOTE — Progress Notes (Signed)
CRITICAL VALUE ALERT  Critical value received:  Troponin 25.18  Date of notification:  No notification given by lab  Time of notification:  na  Critical value read back:No.  Nurse who received alert: self alerted  MD notified (1st page):  Dr Terrence Dupont  Time of first page:  [phone]  MD notified (2nd page):  Time of second page:  Responding MD:  Dr Terrence Dupont  Time MD responded:  [phone]

## 2014-08-06 NOTE — H&P (Signed)
Dustin Acosta. is an 81 y.o. male.   Chief Complaint: Chest pain associated with shortness of breath HPI: Patient is 81 year old male with past medical history significant for coronary artery disease history of MI many years ago per patient, hypertension, history of left parietal CVA, chronic atrial fibrillation on no anticoagulants patient refused in the past, chronic thrombocytopenia, came to the ER by EMS as code STEMI was called. Patient woke up with retrosternal chest pain described as pressure associated with mild shortness of breath around 5 AM received aspirin and sublingual nitroglycerin with partial relief. Chest pain was localized. Denies any nausea vomiting diaphoresis. Denies palpitation lightheadedness or syncope. EKG done showed atrial fibrillation with moderate ventricular response with ST elevation in inferior leads and ST depression in lead V1 and V2 with rest protocol changes in lead 1 and aVL suggestive of acute inferoposterior injury. Patient denies such episodes of recent chest pain states he is very active  Past Medical History  Diagnosis Date  . Coronary artery disease   . Hypertension     History reviewed. No pertinent past surgical history.  No family history on file. Social History:  reports that he has never smoked. He does not have any smokeless tobacco history on file. He reports that he does not drink alcohol or use illicit drugs.  Allergies: No Known Allergies  No prescriptions prior to admission    Results for orders placed or performed during the hospital encounter of 08/06/14 (from the past 48 hour(s))  CBC     Status: Abnormal   Collection Time: 08/06/14  6:44 AM  Result Value Ref Range   WBC 5.9 4.0 - 10.5 K/uL   RBC 4.22 4.22 - 5.81 MIL/uL   Hemoglobin 14.3 13.0 - 17.0 g/dL   HCT 42.4 39.0 - 52.0 %   MCV 100.5 (H) 78.0 - 100.0 fL   MCH 33.9 26.0 - 34.0 pg   MCHC 33.7 30.0 - 36.0 g/dL   RDW 12.9 11.5 - 15.5 %   Platelets 102 (L) 150 - 400  K/uL  POCT i-Stat troponin I     Status: None   Collection Time: 08/06/14  6:47 AM  Result Value Ref Range   Troponin i, poc 0.00 0.00 - 0.08 ng/mL   Comment 3            Comment: Due to the release kinetics of cTnI, a negative result within the first hours of the onset of symptoms does not rule out myocardial infarction with certainty. If myocardial infarction is still suspected, repeat the test at appropriate intervals.    No results found.  Review of Systems  Constitutional: Negative for fever and chills.  Eyes: Negative for double vision.  Respiratory: Positive for shortness of breath.   Cardiovascular: Positive for chest pain. Negative for palpitations, orthopnea, claudication and leg swelling.  Gastrointestinal: Negative for nausea, vomiting and abdominal pain.  Genitourinary: Negative for dysuria.  Neurological: Negative for dizziness and headaches.    Blood pressure 124/91, pulse 96, temperature 97.3 F (36.3 C), temperature source Oral, resp. rate 23, height 5\' 9"  (1.753 m), weight 73.483 kg (162 lb), SpO2 100 %. Physical Exam  Constitutional: He is oriented to person, place, and time.  HENT:  Head: Atraumatic.  Eyes: Conjunctivae are normal. Left eye exhibits no discharge. No scleral icterus.  Neck: Normal range of motion. Neck supple. No JVD present. No thyromegaly present.  Cardiovascular:  Irregularly irregular S1 and S2 soft soft S4 gallop noted  Respiratory:  Effort normal and breath sounds normal. No respiratory distress. He has no wheezes.  GI: Soft. Bowel sounds are normal. He exhibits no distension. There is no tenderness.  Musculoskeletal: He exhibits no edema or tenderness.  Neurological: He is alert and oriented to person, place, and time.     Assessment/Plan Acute inferoposterior wall myocardial infarction Coronary artery disease Hypertension Chronic atrial fibrillation History of left parietal CVA Chronic thrombocytopenia Plan Discussed with  patient regarding emergency left cath possible PTCA stenting its risk and benefits i.e. death MI stroke need for emergency CABG local vascular complications etc. and consented for PCI  Charolette Forward 08/06/2014, 8:04 AM

## 2014-08-06 NOTE — Progress Notes (Signed)
Pt educated on procedure, explained need to lie flat after removal d/t possible bleeding risk. Atropine at bedside. R femoral sheath pulled per protocol. Manual pressure held x15 min. After hemostasis, ble equally warm, pink, dry, cap refill <3 secs, pulses 2+. Dressing placed. Pt educated to put pressure on site for coughing, laughing, sneezing. Reported understanding. VSS throughout. RN notified.

## 2014-08-06 NOTE — ED Provider Notes (Signed)
CSN: [id number]     Arrival date & time 08/06/14  3335 History   First MD Initiated Contact with Patient 08/06/14 [phone]     Chief Complaint  Patient presents with  . Chest Pain     (Consider location/radiation/quality/duration/timing/severity/associated sxs/prior Treatment) HPI  This is an 81 year old male with history of coronary artery disease and hypertension who presents with chest pain. Onset of chest pain and shortness of breath approximate 5 AM. Took 4 baby aspirin and 1 sublingual nitroglycerin prior to EMS arrival. EMS EKG showed inferior ST elevation with anterior depressions. Code STEMI activated prior to patient arrival.  Upon arrival, patient awake, alert, and oriented. 4 out of 10 chest pain. Minimal improvement with nitroglycerin. Vital signs stable in route.  Level 5 caveat  Past Medical History  Diagnosis Date  . Coronary artery disease   . Hypertension    History reviewed. No pertinent past surgical history. No family history on file. History  Substance Use Topics  . Smoking status: Never Smoker   . Smokeless tobacco: Not on file  . Alcohol Use: No    Review of Systems  Unable to perform ROS: Acuity of condition  Constitutional: Negative.  Negative for fever.  Respiratory: Positive for chest tightness and shortness of breath.   Cardiovascular: Positive for chest pain. Negative for leg swelling.      Allergies  Review of patient's allergies indicates no known allergies.  Home Medications   Prior to Admission medications   Not on File   BP 124/91 mmHg  Pulse 96  Temp(Src) 97.3 F (36.3 C) (Oral)  Resp 23  Ht 5\' 9"  (1.753 m)  Wt 162 lb (73.483 kg)  BMI 23.91 kg/m2  SpO2 100% Physical Exam  Constitutional: He is oriented to person, place, and time. He appears well-developed and well-nourished. No distress.  Elderly  HENT:  Head: Normocephalic and atraumatic.  Cardiovascular: Normal rate, regular rhythm and normal heart sounds.   No murmur  heard. Pulmonary/Chest: Effort normal and breath sounds normal. No respiratory distress. He has no wheezes.  Abdominal: Soft. There is no tenderness.  Musculoskeletal: He exhibits no edema.  Neurological: He is alert and oriented to person, place, and time.  Skin: Skin is warm and dry.  Psychiatric: He has a normal mood and affect.  Nursing note and vitals reviewed.   ED Course  Procedures (including critical care time)  CRITICAL CARE Performed by: Merryl Hacker   Total critical care time: 35 min  Critical care time was exclusive of separately billable procedures and treating other patients.  Critical care was necessary to treat or prevent imminent or life-threatening deterioration.  Critical care was time spent personally by me on the following activities: development of treatment plan with patient and/or surrogate as well as nursing, discussions with consultants, evaluation of patient's response to treatment, examination of patient, obtaining history from patient or surrogate, ordering and performing treatments and interventions, ordering and review of laboratory studies, ordering and review of radiographic studies, pulse oximetry and re-evaluation of patient's condition.  Labs Review Labs Reviewed  CBC - Abnormal; Notable for the following:    MCV 100.5 (*)    Platelets 102 (*)    All other components within normal limits  APTT  COMPREHENSIVE METABOLIC PANEL  PROTIME-INR  I-STAT TROPOININ, ED  POCT I-STAT TROPONIN I    Imaging Review No results found.   EKG Interpretation EKG shows atrial fibrillation with ST elevations inferiorly and depressions anteriorly concerning for ST elevation MI  MDM   Final diagnoses:  ST elevation myocardial infarction (STEMI), unspecified artery     Patient presents as a code STEMI. Confirmed on EKG. Discussed with Dr. Terrence Dupont. Patient given Brilinta and heparin bolus. Heparin drip ordered. Basic labwork ordered.   Nitroglycerin held given changes anteriorly.  Patient taken emergently to the Cath Lab.    Merryl Hacker, MD 08/06/14 [phone]

## 2014-08-06 NOTE — ED Notes (Signed)
Pt. arrived with EMS from home reports central chest pain with SOB onset 5 am this morning , received fentanyl 75 mcg IV ,  4 baby ASA and 1 NTG sl spray with mild relief prior to arrival . Denies emesis or diaphoresis .

## 2014-08-07 LAB — LIPID PANEL
Cholesterol: 180 mg/dL (ref 0–200)
HDL: 66 mg/dL (ref >40)
LDL CALC: 101 mg/dL — AB (ref 0–99)
Total CHOL/HDL Ratio: 2.7 RATIO
Triglycerides: 63 mg/dL (ref <150)
VLDL: 13 mg/dL (ref 0–40)

## 2014-08-07 LAB — CBC
HCT: 40 % (ref 39.0–52.0)
Hemoglobin: 13.2 g/dL (ref 13.0–17.0)
MCH: 32.9 pg (ref 26.0–34.0)
MCHC: 33 g/dL (ref 30.0–36.0)
MCV: 99.8 fL (ref 78.0–100.0)
PLATELETS: 97 10*3/uL — AB (ref 150–400)
RBC: 4.01 MIL/uL — AB (ref 4.22–5.81)
RDW: 12.9 % (ref 11.5–15.5)
WBC: 5.6 10*3/uL (ref 4.0–10.5)

## 2014-08-07 LAB — BASIC METABOLIC PANEL
ANION GAP: 9 (ref 5–15)
BUN: 13 mg/dL (ref 6–20)
CO2: 22 mmol/L (ref 22–32)
Calcium: 8.4 mg/dL — ABNORMAL LOW (ref 8.9–10.3)
Chloride: 105 mmol/L (ref 101–111)
Creatinine, Ser: 0.84 mg/dL (ref 0.61–1.24)
GFR calc Af Amer: >60 mL/min (ref >60)
GFR calc non Af Amer: >60 mL/min (ref >60)
Glucose, Bld: 103 mg/dL — ABNORMAL HIGH (ref 65–99)
Potassium: 4.1 mmol/L (ref 3.5–5.1)
SODIUM: 136 mmol/L (ref 135–145)

## 2014-08-07 NOTE — Progress Notes (Signed)
Subjective:  Doing well denies any chest pain or shortness of breath. Complains of occasional GERD symptoms. Remains in atrial fibrillation with controlled ventricular response  Objective:  Vital Signs in the last 24 hours: Temp:  [97.2 F (36.2 C)-97.8 F (36.6 C)] 97.8 F (36.6 C) (05/22 0808) Pulse Rate:  [45-150] 83 (05/22 1000) Resp:  [13-30] 20 (05/22 1000) BP: (88-138)/(56-101) 117/101 mmHg (05/22 1000) SpO2:  [88 %-100 %] 98 % (05/22 1000)  Intake/Output from previous day: 05/21 0701 - 05/22 0700 In: 1124.2 [P.O.:360; I.V.:764.2] Out: 1050 [Urine:1050] Intake/Output from this shift:    Physical Exam: Neck: no adenopathy, no carotid bruit, no JVD and supple, symmetrical, trachea midline Lungs: clear to auscultation bilaterally Heart: irregularly irregular rhythm, S1, S2 normal and Soft systolic murmur noted no S3 gallop Abdomen: soft, non-tender; bowel sounds normal; no masses,  no organomegaly Extremities: extremities normal, atraumatic, no cyanosis or edema and Right groin stable  Lab Results:  Recent Labs  08/06/14 1002 08/07/14 0256  WBC 5.9 5.6  HGB 14.5 13.2  PLT 103* 97*    Recent Labs  08/06/14 1002 08/07/14 0256  NA 139 136  K 4.4 4.1  CL 104 105  CO2 26 22  GLUCOSE 114* 103*  BUN 10 13  CREATININE 0.86 0.84    Recent Labs  08/06/14 1611 08/06/14 2055  TROPONINI 25.18* 22.71*   Hepatic Function Panel  Recent Labs  08/06/14 1002  PROT 6.3*  ALBUMIN 3.5  AST 64*  ALT 20  ALKPHOS 62  BILITOT 0.8    Recent Labs  08/07/14 0256  CHOL 180   No results for input(s): PROTIME in the last 72 hours.  Imaging: Imaging results have been reviewed and No results found.  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  LOS: 1 day    Charolette Forward 08/07/2014, 12:03 PM

## 2014-08-07 NOTE — Progress Notes (Signed)
Pt ambulated to 2W37 with front wheel walker on monitor without event. Daughter, Hassan Rowan, phoned to make aware of room change. Pt's pajamas were transferred with him along with his glasses. RN Gae Bon in room for tele hook up and bedside report.   Lorre Munroe

## 2014-08-07 NOTE — Progress Notes (Addendum)
Pt transferred to 2W37 from 2S. Report received and pt family made aware of transfer. VSS . Pt denies pain. Pt oriented to room. Bed low, callbell in place. Will continue to monitor.  Raliegh Ip RN

## 2014-08-08 ENCOUNTER — Encounter (HOSPITAL_COMMUNITY): Payer: Self-pay | Admitting: Cardiology

## 2014-08-08 LAB — BASIC METABOLIC PANEL
Anion gap: 7 (ref 5–15)
BUN: 11 mg/dL (ref 6–20)
CHLORIDE: 107 mmol/L (ref 101–111)
CO2: 24 mmol/L (ref 22–32)
Calcium: 8.4 mg/dL — ABNORMAL LOW (ref 8.9–10.3)
Creatinine, Ser: 0.88 mg/dL (ref 0.61–1.24)
GFR calc Af Amer: >60 mL/min (ref >60)
GFR calc non Af Amer: >60 mL/min (ref >60)
Glucose, Bld: 98 mg/dL (ref 65–99)
Potassium: 3.8 mmol/L (ref 3.5–5.1)
Sodium: 138 mmol/L (ref 135–145)

## 2014-08-08 LAB — CBC
HCT: 38.3 % — ABNORMAL LOW (ref 39.0–52.0)
HEMOGLOBIN: 12.7 g/dL — AB (ref 13.0–17.0)
MCH: 32.6 pg (ref 26.0–34.0)
MCHC: 33.2 g/dL (ref 30.0–36.0)
MCV: 98.5 fL (ref 78.0–100.0)
Platelets: 94 10*3/uL — ABNORMAL LOW (ref 150–400)
RBC: 3.89 MIL/uL — ABNORMAL LOW (ref 4.22–5.81)
RDW: 13 % (ref 11.5–15.5)
WBC: 4 10*3/uL (ref 4.0–10.5)

## 2014-08-08 LAB — TROPONIN I: Troponin I: 8.24 ng/mL (ref <0.031)

## 2014-08-08 LAB — POCT ACTIVATED CLOTTING TIME: ACTIVATED CLOTTING TIME: 564 s

## 2014-08-08 MED ORDER — METOPROLOL TARTRATE 25 MG PO TABS
25.0000 mg | ORAL_TABLET | Freq: Two times a day (BID) | ORAL | Status: DC
  Administered 2014-08-08 – 2014-08-09 (×2): 25 mg via ORAL
  Filled 2014-08-08 (×3): qty 1

## 2014-08-08 MED ORDER — CLOPIDOGREL BISULFATE 75 MG PO TABS
75.0000 mg | ORAL_TABLET | Freq: Every day | ORAL | Status: DC
  Administered 2014-08-08 – 2014-08-09 (×2): 75 mg via ORAL
  Filled 2014-08-08 (×2): qty 1

## 2014-08-08 MED FILL — Heparin Sodium (Porcine) 2 Unit/ML in Sodium Chloride 0.9%: INTRAMUSCULAR | Qty: 1500 | Status: AC

## 2014-08-08 MED FILL — Lidocaine HCl Local Preservative Free (PF) Inj 1%: INTRAMUSCULAR | Qty: 30 | Status: AC

## 2014-08-08 NOTE — Progress Notes (Signed)
Utilization review complete. Kwinton Maahs RN CCM Case Mgmt phone [phone] 

## 2014-08-08 NOTE — Progress Notes (Signed)
Subjective:  Denies any chest pain or shortness of breath. Occasional episode of A. fib with RVR with minimal ambulation.  Objective:  Vital Signs in the last 24 hours: Temp:  [97.3 F (36.3 C)-98.4 F (36.9 C)] 98.4 F (36.9 C) (05/23 0440) Pulse Rate:  [53-98] 87 (05/23 0440) Resp:  [17-28] 18 (05/23 0440) BP: (84-126)/(45-75) 93/45 mmHg (05/23 0440) SpO2:  [92 %-98 %] 95 % (05/23 0440)  Intake/Output from previous day: 05/22 0701 - 05/23 0700 In: 123 [P.O.:120; I.V.:3] Out: 1600 [Urine:1600] Intake/Output from this shift: Total I/O In: 480 [P.O.:480] Out: -   Physical Exam: Neck: no adenopathy, no carotid bruit, no JVD and supple, symmetrical, trachea midline Lungs: clear to auscultation bilaterally Heart: irregularly irregular rhythm, S1, S2 normal and Soft systolic murmur noted no S3 gallop Abdomen: soft, non-tender; bowel sounds normal; no masses,  no organomegaly Extremities: extremities normal, atraumatic, no cyanosis or edema and Right groin stable  Lab Results:  Recent Labs  08/07/14 0256 08/08/14 0425  WBC 5.6 4.0  HGB 13.2 12.7*  PLT 97* 94*    Recent Labs  08/07/14 0256 08/08/14 0425  NA 136 138  K 4.1 3.8  CL 105 107  CO2 22 24  GLUCOSE 103* 98  BUN 13 11  CREATININE 0.84 0.88    Recent Labs  08/06/14 2055 08/08/14 0425  TROPONINI 22.71* 8.24*   Hepatic Function Panel  Recent Labs  08/06/14 1002  PROT 6.3*  ALBUMIN 3.5  AST 64*  ALT 20  ALKPHOS 62  BILITOT 0.8    Recent Labs  08/07/14 0256  CHOL 180   No results for input(s): PROTIME in the last 72 hours.  Imaging: Imaging results have been reviewed and No results found.  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

## 2014-08-08 NOTE — Progress Notes (Addendum)
Pt ambulated 335ft in hallways with RN; pt denies and no sob, tachycardia; respiratory distress or discomfort witnessed. Pt back in room in bed comfortably with call light within reach. Reported off to incoming RN. Francis Gaines [place] Inthavong RN.

## 2014-08-08 NOTE — Progress Notes (Addendum)
CARDIAC REHAB PHASE I   PRE:  Rate/Rhythm: 92 a. fib  BP:  Sitting: 113/69        SaO2: 98 RA  MODE:  Ambulation: 460 ft   POST:  Rate/Rhythm: 130 a. fib  BP:  Sitting: 111/58         SaO2: 96 Ra  Pt ambulated  460 ft on RA, handheld assist, rolling walker, mostly steady gait, tolerated well.  Pt denied DOE, cp, dizziness, declined rest stop. Pt states he sometimes feels unsteady but does not have a walker at home. Pt would benefit from having rolling walker. Notified by RN during ambulation that pt HR increased to 170's a. fib. Pt asymptomatic, improved with rest. Completed MI/stent education.  Reviewed anti-platelet therapy (pt is concerned brilinta is making him "sick on his stomach"), risk factors, stent card, activity restrictions, ntg, exercise, heart healthy diet, phase 2 cardiac rehab. Pt verbalized understanding. Pt declines phase 2 cardiac rehab, states he knows what it is and is not interested.  Pt still needs 30 day brilinta card, will notify case manager. Pt to recliner after walk, call bell within reach.   [phone]   Lenna Sciara, RN, BSN 08/08/2014 9:43 AM

## 2014-08-09 MED ORDER — NITROGLYCERIN 0.4 MG SL SUBL: 0.4000 mg | SUBLINGUAL_TABLET | SUBLINGUAL | Status: AC | PRN

## 2014-08-09 MED ORDER — CLOPIDOGREL BISULFATE 75 MG PO TABS: 75.0000 mg | ORAL_TABLET | Freq: Every day | ORAL | Status: DC

## 2014-08-09 MED ORDER — METOPROLOL SUCCINATE ER 50 MG PO TB24: 50.0000 mg | ORAL_TABLET | Freq: Every day | ORAL | Status: DC

## 2014-08-09 MED ORDER — PANTOPRAZOLE SODIUM 40 MG PO TBEC: 40.0000 mg | DELAYED_RELEASE_TABLET | Freq: Every day | ORAL | Status: DC

## 2014-08-09 MED ORDER — ATORVASTATIN CALCIUM 40 MG PO TABS
40.0000 mg | ORAL_TABLET | Freq: Every day | ORAL | Status: DC
Start: 2014-08-09 — End: 2020-09-02

## 2014-08-09 NOTE — Progress Notes (Signed)
Pt discharge education and instructions completed with pt and family at bedside. All voices understanding and denies any questions. Pt IV and telemetry removed; pt discharge home with family to transport him home. Pt to pick up electronically sent prescriptions from preferred pharmacy on file. Pt transported off unit via wheelchair with belongings and family at side. Francis Gaines Ayona Yniguez RN.

## 2014-08-09 NOTE — Discharge Instructions (Signed)
° °  ° °Acute Coronary Syndrome °Acute coronary syndrome (ACS) is an urgent problem in which the blood and oxygen supply to the heart is critically deficient. ACS requires hospitalization because one or more coronary arteries may be blocked. °ACS represents a range of conditions including: °· Previous angina that is now unstable, lasts longer, happens at rest, or is more intense. °· A heart attack, with heart muscle cell injury and death. °There are three vital coronary arteries that supply the heart muscle with blood and oxygen so that it can pump blood effectively. If blockages to these arteries develop, blood flow to the heart muscle is reduced. If the heart does not get enough blood, angina may occur as the first warning sign. °SYMPTOMS  °· The most common signs of angina include: °· Tightness or squeezing in the chest. °· Feeling of heaviness on the chest. °· Discomfort in the arms, neck, back, or jaw. °· Shortness of breath and nausea. °· Cold, wet skin. °· Angina is usually brought on by physical effort or excitement which increase the oxygen needs of the heart. These states increase the blood flow needs of the heart beyond what can be delivered. °· Other symptoms that are not as common include: °· Fatigue °· Unexplained feelings of nervousness or anxiety °· Weakness °· Diarrhea °· Sometimes, you may not have noticed any symptoms at all but still suffered a cardiac injury. °TREATMENT  °· Medicines to help discomfort may include nitroglycerin (nitro) in the form of tablets or a spray for rapid relief, or longer-acting forms such as cream, patches, or capsules. (Be aware that there are many side effects and possible interactions with other drugs). °· Other medicines may be used to help the heart pump better. °· Procedures to open blocked arteries including angioplasty or stent placement to keep the arteries open. °· Open heart surgery may be needed when there are many blockages or they are in critical  locations that are best treated with surgery. °HOME CARE INSTRUCTIONS  °· Do not use any tobacco products including cigarettes, chewing tobacco, or electronic cigarettes. °· Take one baby or adult aspirin daily, if your health care provider advises. This helps reduce the risk of a heart attack. °· It is very important that you follow the angina treatment prescribed by your health care provider. Make arrangements for proper follow-up care. °· Eat a heart healthy diet with salt and fat restrictions as advised. °· Regular exercise is good for you as long as it does not cause discomfort. Do not begin any new type of exercise until you check with your health care provider. °· If you are overweight, you should lose weight. °· Try to maintain normal blood lipid levels. °· Keep your blood pressure under control as recommended by your health care provider. °· You should tell your health care provider right away about any increase in the severity or frequency of your chest discomfort or angina attacks. When you have angina, you should stop what you are doing and sit down. This may bring relief in 3 to 5 minutes. If your health care provider has prescribed nitro, take it as directed. °· If your health care provider has given you a follow-up appointment, it is very important to keep that appointment. Not keeping the appointment could result in a chronic or permanent injury, pain, and disability. If there is any problem keeping the appointment, you must call back to this facility for assistance. °SEEK IMMEDIATE MEDICAL CARE IF:  °· You develop   nausea, vomiting, or shortness of breath. °· You feel faint, lightheaded, or pass out. °· Your chest discomfort gets worse. °· You are sweating or experience sudden profound fatigue. °· You do not get relief of your chest pain after 3 doses of nitro. °· Your discomfort lasts longer than 15 minutes. °MAKE SURE YOU:  °· Understand these instructions. °· Will watch your condition. °· Will get  help right away if you are not doing well or get worse. °· Take all medicines as directed by your health care provider. °Document Released: 03/04/2005 Document Revised: 03/09/2013 Document Reviewed: 07/06/2013 °Exit[employer]® Patient Information ©2015 [employer]. This information is not intended to replace advice given to you by your health care provider. Make sure you discuss any questions you have with your health care provider. °Coronary Angiogram with Stent °Coronary angiography with stent placement is a procedure to widen or open a narrow blood vessel of the heart (coronary artery). When a coronary artery becomes partially blocked, it decreases blood flow to that area. This may lead to chest pain or a heart attack (myocardial infarction). Arteries may become blocked by cholesterol buildup (plaque) in the lining or wall.  °A stent is a small piece of metal that looks like a mesh or a spring. Stent placement may be done right after a coronary angiography in which a blocked artery is found or as a treatment for a heart attack.  °LET YOUR HEALTH CARE PROVIDER KNOW ABOUT: °· Any allergies you have.   °· All medicines you are taking, including vitamins, herbs, eye drops, creams, and over-the-counter medicines.   °· Previous problems you or members of your family have had with the use of anesthetics.   °· Any blood disorders you have.   °· Previous surgeries you have had.   °· Medical conditions you have. °RISKS AND COMPLICATIONS °Generally, coronary angiography with stent is a safe procedure. However, problems can occur and include: °· Damage to the heart or its blood vessels.   °· A return of blockage.   °· Bleeding, infection, or bruising at the insertion site.   °· A collection of blood under the skin (hematoma) at the insertion site. °· Blood clot in another part of the body.   °· Kidney injury.   °· Allergic reaction to the dye or contrast used.   °· Bleeding into the abdomen (retroperitoneal bleeding). °BEFORE  THE PROCEDURE °· Do not eat or drink anything after midnight on the night before the procedure or as directed by your health care provider.  °· Ask your health care provider about changing or stopping your regular medicines. This is especially important if you are taking diabetes medicines or blood thinners. °· Your health care provider will make sure you understand the procedure as well as the risks and potential problems associated with the procedure.   °PROCEDURE °· You may be given a medicine to help you relax before and during the procedure (sedative). This medicine will be given through an IV tube that is put into one of your veins.   °· The area where the catheter will be inserted will be shaved and cleaned. This is usually done in the groin but may be done in the fold of your arm (near your elbow) or in the wrist.    °· A medicine will be given to numb the area where the catheter will be inserted (local anesthetic).   °· The catheter will be inserted into an artery using a guide wire. A type of X-ray (fluoroscopy) will be used to help guide the catheter to the opening of the blocked   artery.   °· A dye will then be injected into the catheter, and X-rays will be taken. The dye will help to show where any narrowing or blockages are located in the heart arteries.   °· A tiny wire will be guided to the blocked spot, and a balloon will be inflated to make the artery wider. The stent will be expanded and will crush the plaque into the wall of the vessel. The stent will hold the area open like a scaffolding and improve the blood flow.   °· Sometimes the artery may be made wider using a laser or other tools to remove plaque.   °· When the blood flow is better, the catheter will be removed. The lining of the artery will grow over the stent, which stays where it was placed.   °AFTER THE PROCEDURE °· If the procedure is done through the leg, you will be kept in bed lying flat for about 6 hours. You will be instructed to  not bend or cross your legs.   °· The insertion site will be checked frequently.   °· The pulse in your feet or wrist will be checked frequently.   °· Additional blood tests, X-rays, and electrocardiography may be done. °Document Released: 09/08/2002 Document Revised: 07/19/2013 Document Reviewed: 09/10/2012 °ExitCare® Patient Information ©2015 [employer]. This information is not intended to replace advice given to you by your health care provider. Make sure you discuss any questions you have with your health care provider. ° °

## 2014-08-09 NOTE — Discharge Summary (Signed)
Discharge summary dictated on 08/09/2014 dictation number is [phone]

## 2014-08-10 NOTE — Discharge Summary (Signed)
NAMEASHAAD, GAERTNER NO.:  [id number]  MEDICAL RECORD NO.:  [id number]  LOCATION:  2W37C                        FACILITY:  [facility]  PHYSICIAN:  Lorita Forinash N. Terrence Dupont, M.D. DATE OF BIRTH:  06-Oct-1924  DATE OF ADMISSION:  08/06/2014 DATE OF DISCHARGE:  08/09/2014                              DISCHARGE SUMMARY   ADMITTING DIAGNOSES: 1. Acute inferoposterior wall myocardial infarction. 2. Coronary artery disease. 3. Hypertension. 4. Chronic atrial fibrillation. 5. History of left parietal CVA in the past. 6. Chronic thrombocytopenia.  DISCHARGE DIAGNOSES: 1. Status post acute inferoposterior wall myocardial infarction status     post percutaneous coronary intervention to 100% occluded right     coronary artery. 2. Coronary artery disease. 3. Hypertension. 4. Chronic atrial fibrillation. 5. History of left parietal CVA in the past. 6. Chronic thrombocytopenia.  DISCHARGE HOME MEDICATIONS: 1. Atorvastatin 40 mg 1 tablet daily. 2. Clopidogrel 75 mg 1 tablet daily. 3. Nitrostat 0.4 mg sublingual p.r.n. 4. Protonix 40 mg daily. 5. Aspirin 81 mg daily. 6. Multivitamin 1 tablet daily. 7. Kenalog ointment as needed as before. 8. Metoprolol succinate 50 mg daily.  DIET:  Low-salt, low-cholesterol.  ACTIVITY:  Increase activity slowly as tolerated.  The patient will be scheduled for phase 2 cardiac rehab as outpatient.  CONDITION AT DISCHARGE:  Stable.  BRIEF HISTORY AND HOSPITAL COURSE:  Mr. Lares is an 81 year old male with past medical history significant for coronary artery disease, history of MI many years ago as per patient, hypertension, history of left parietal CVA, chronic atrial fibrillation, on no anticoagulants. Patient refused any anticoagulants in the past, chronic thrombocytopenia.  He came to the ER by EMS as code STEMI was called. The patient woke up with retrosternal chest pain described as pressure associated with mild shortness of breath  around 5 a.m., received aspirin and sublingual nitro with partial relief of chest pain.  Chest pain was localized.  Denies any nausea, vomiting, diaphoresis.  Denies palpitation, lightheadedness or syncope.  EKG showed atrial fibrillation with moderate ventricular response with ST elevation in inferior leads and ST depression in lead V1 and V2 with reciprocal changes in lead 1, aVL suggestive of acute inferoposterior wall injury.  The patient denies such episodes of chest pain in the past.  States he is normally very active.  No history of exertional chest pain.  PHYSICAL EXAMINATION:  GENERAL:  He was alert, awake, oriented x3. VITAL SIGNS:  Blood pressure was 124/91, pulse was 96, irregularly irregular.  He was afebrile. HEENT:  Conjunctivae were pink. NECK:  Supple.  No JVD.  No bruit. LUNGS:  Clear to auscultation without rhonchi, rales. CARDIOVASCULAR: S1, S2 was soft, irregularly irregular.  There was soft systolic murmur and S4 gallop. ABDOMEN:  Soft.  Bowel sounds were present.  Nontender. EXTREMITIES:  There is no clubbing, cyanosis, or edema.  LABORATORY DATA:  His labs, sodium was 139, potassium 3.8, BUN 11, creatinine 0.85.  Hemoglobin was 14.3, hematocrit 42.4, platelet count 102,000.  His troponin I is 4.81, repeat post PCI was 25.18, 22.71, and 8.24 which is trending down.  BRIEF HOSPITAL COURSE:  The patient was directly brought to the cath lab and underwent emergency PTCA stenting  to 100% occluded RCA with excellent angiographic results.  Patient tolerated the procedure well. There were no complications.  Postprocedure, patient did not have any episodes of chest pain during the hospital stay.  His groin is stable with no evidence of hematoma or bruit.  The patient had episode of AFib with RVR.  His beta-blocker dose has been increased which he is tolerating it okay.  The patient will be discharged home on above medications and will be followed up in my office in 1  week.  We will discuss further with patient regarding chronic anticoagulation which he has refused, but agrees for aspirin and Plavix.     Allegra Lai. Terrence Dupont, M.D.     MNH/MEDQ  D:  08/09/2014  T:  08/10/2014  Job:  [id number]

## 2014-10-03 DIAGNOSIS — I25118 Atherosclerotic heart disease of native coronary artery with other forms of angina pectoris: Secondary | ICD-10-CM | POA: Insufficient documentation

## 2014-11-28 ENCOUNTER — Other Ambulatory Visit: Payer: Self-pay | Admitting: Cardiology

## 2014-11-28 DIAGNOSIS — R600 Localized edema: Secondary | ICD-10-CM

## 2014-11-29 ENCOUNTER — Ambulatory Visit: Payer: Medicare Other

## 2014-12-02 ENCOUNTER — Ambulatory Visit
Admission: RE | Admit: 2014-12-02 | Discharge: 2014-12-02 | Disposition: A | Discharge status: Home / Self Care | Payer: Medicare Other | Source: Ambulatory Visit | Attending: Cardiology | Admitting: Cardiology

## 2014-12-02 DIAGNOSIS — R600 Localized edema: Secondary | ICD-10-CM | POA: Diagnosis present

## 2014-12-19 DIAGNOSIS — R600 Localized edema: Secondary | ICD-10-CM | POA: Insufficient documentation

## 2015-03-14 ENCOUNTER — Other Ambulatory Visit: Payer: Self-pay | Admitting: Cardiology

## 2015-03-14 DIAGNOSIS — I69398 Other sequelae of cerebral infarction: Principal | ICD-10-CM

## 2015-03-14 DIAGNOSIS — R531 Weakness: Secondary | ICD-10-CM

## 2015-03-14 DIAGNOSIS — I69359 Hemiplegia and hemiparesis following cerebral infarction affecting unspecified side: Secondary | ICD-10-CM

## 2015-03-23 ENCOUNTER — Ambulatory Visit
Admission: RE | Admit: 2015-03-23 | Discharge: 2015-03-23 | Disposition: A | Discharge status: Home / Self Care | Payer: Medicare Other | Source: Ambulatory Visit | Attending: Cardiology | Admitting: Cardiology

## 2015-03-23 DIAGNOSIS — J349 Unspecified disorder of nose and nasal sinuses: Secondary | ICD-10-CM | POA: Diagnosis not present

## 2015-03-23 DIAGNOSIS — M6289 Other specified disorders of muscle: Secondary | ICD-10-CM | POA: Insufficient documentation

## 2015-03-23 DIAGNOSIS — I69359 Hemiplegia and hemiparesis following cerebral infarction affecting unspecified side: Secondary | ICD-10-CM | POA: Diagnosis present

## 2015-03-23 DIAGNOSIS — R531 Weakness: Secondary | ICD-10-CM

## 2015-03-23 DIAGNOSIS — I639 Cerebral infarction, unspecified: Secondary | ICD-10-CM | POA: Diagnosis not present

## 2015-03-23 DIAGNOSIS — I69398 Other sequelae of cerebral infarction: Secondary | ICD-10-CM

## 2015-03-23 LAB — POCT I-STAT CREATININE: Creatinine, Ser: 0.9 mg/dL (ref 0.61–1.24)

## 2015-03-23 MED ORDER — IOHEXOL 300 MG/ML  SOLN
75.0000 mL | Freq: Once | INTRAMUSCULAR | Status: AC | PRN
  Administered 2015-03-23: 75 mL via INTRAVENOUS

## 2015-03-28 ENCOUNTER — Emergency Department
Admission: EM | Admit: 2015-03-28 | Discharge: 2015-03-28 | Disposition: A | Discharge status: Home / Self Care | Payer: Medicare Other | Attending: Emergency Medicine | Admitting: Emergency Medicine

## 2015-03-28 DIAGNOSIS — Y998 Other external cause status: Secondary | ICD-10-CM | POA: Diagnosis not present

## 2015-03-28 DIAGNOSIS — I10 Essential (primary) hypertension: Secondary | ICD-10-CM | POA: Insufficient documentation

## 2015-03-28 DIAGNOSIS — X58XXXA Exposure to other specified factors, initial encounter: Secondary | ICD-10-CM | POA: Diagnosis not present

## 2015-03-28 DIAGNOSIS — Z79899 Other long term (current) drug therapy: Secondary | ICD-10-CM | POA: Diagnosis not present

## 2015-03-28 DIAGNOSIS — Y9289 Other specified places as the place of occurrence of the external cause: Secondary | ICD-10-CM | POA: Diagnosis not present

## 2015-03-28 DIAGNOSIS — T7840XA Allergy, unspecified, initial encounter: Secondary | ICD-10-CM | POA: Diagnosis present

## 2015-03-28 DIAGNOSIS — Z7982 Long term (current) use of aspirin: Secondary | ICD-10-CM | POA: Diagnosis not present

## 2015-03-28 DIAGNOSIS — R21 Rash and other nonspecific skin eruption: Secondary | ICD-10-CM | POA: Diagnosis not present

## 2015-03-28 DIAGNOSIS — Y9389 Activity, other specified: Secondary | ICD-10-CM | POA: Diagnosis not present

## 2015-03-28 DIAGNOSIS — Z7952 Long term (current) use of systemic steroids: Secondary | ICD-10-CM | POA: Diagnosis not present

## 2015-03-28 DIAGNOSIS — I499 Cardiac arrhythmia, unspecified: Secondary | ICD-10-CM | POA: Insufficient documentation

## 2015-03-28 DIAGNOSIS — L299 Pruritus, unspecified: Secondary | ICD-10-CM | POA: Insufficient documentation

## 2015-03-28 DIAGNOSIS — R011 Cardiac murmur, unspecified: Secondary | ICD-10-CM | POA: Insufficient documentation

## 2015-03-28 DIAGNOSIS — Z7902 Long term (current) use of antithrombotics/antiplatelets: Secondary | ICD-10-CM | POA: Diagnosis not present

## 2015-03-28 HISTORY — DX: Unspecified atrial fibrillation: I48.91

## 2015-03-28 LAB — CBC WITH DIFFERENTIAL/PLATELET
BASOS PCT: 1 %
Basophils Absolute: 0.1 10*3/uL (ref 0–0.1)
Eosinophils Absolute: 0.3 10*3/uL (ref 0–0.7)
Eosinophils Relative: 4 %
HEMATOCRIT: 46.6 % (ref 40.0–52.0)
HEMOGLOBIN: 15.7 g/dL (ref 13.0–18.0)
Lymphocytes Relative: 46 %
Lymphs Abs: 3.2 10*3/uL (ref 1.0–3.6)
MCH: 33.1 pg (ref 26.0–34.0)
MCHC: 33.6 g/dL (ref 32.0–36.0)
MCV: 98.5 fL (ref 80.0–100.0)
MONOS PCT: 10 %
Monocytes Absolute: 0.7 10*3/uL (ref 0.2–1.0)
NEUTROS ABS: 2.6 10*3/uL (ref 1.4–6.5)
NEUTROS PCT: 39 %
Platelets: 154 10*3/uL (ref 150–440)
RBC: 4.73 MIL/uL (ref 4.40–5.90)
RDW: 14 % (ref 11.5–14.5)
WBC: 6.8 10*3/uL (ref 3.8–10.6)

## 2015-03-28 LAB — BASIC METABOLIC PANEL
ANION GAP: 13 (ref 5–15)
BUN: 20 mg/dL (ref 6–20)
CALCIUM: 9.2 mg/dL (ref 8.9–10.3)
CO2: 24 mmol/L (ref 22–32)
Chloride: 98 mmol/L — ABNORMAL LOW (ref 101–111)
Creatinine, Ser: 1.23 mg/dL (ref 0.61–1.24)
GFR calc non Af Amer: 51 mL/min — ABNORMAL LOW (ref >60)
GFR, EST AFRICAN AMERICAN: 59 mL/min — AB (ref >60)
Glucose, Bld: 197 mg/dL — ABNORMAL HIGH (ref 65–99)
Potassium: 3.8 mmol/L (ref 3.5–5.1)
Sodium: 135 mmol/L (ref 135–145)

## 2015-03-28 LAB — PROTIME-INR
INR: 1.86
Prothrombin Time: 21.4 seconds — ABNORMAL HIGH (ref 11.4–15.0)

## 2015-03-28 MED ORDER — METHYLPREDNISOLONE SODIUM SUCC 125 MG IJ SOLR
125.0000 mg | Freq: Once | INTRAMUSCULAR | Status: AC
  Administered 2015-03-28: 125 mg via INTRAVENOUS
  Filled 2015-03-28: qty 2

## 2015-03-28 MED ORDER — DIPHENHYDRAMINE HCL 25 MG PO CAPS: 25.0000 mg | ORAL_CAPSULE | Freq: Four times a day (QID) | ORAL | Status: DC | PRN

## 2015-03-28 MED ORDER — FAMOTIDINE IN NACL 20-0.9 MG/50ML-% IV SOLN
20.0000 mg | Freq: Once | INTRAVENOUS | Status: AC
  Administered 2015-03-28: 20 mg via INTRAVENOUS
  Filled 2015-03-28: qty 50

## 2015-03-28 MED ORDER — DIPHENHYDRAMINE HCL 50 MG/ML IJ SOLN
25.0000 mg | Freq: Once | INTRAMUSCULAR | Status: AC
  Administered 2015-03-28: 25 mg via INTRAVENOUS
  Filled 2015-03-28: qty 1

## 2015-03-28 MED ORDER — EPINEPHRINE 0.3 MG/0.3ML IJ SOAJ: 0.3000 mg | Freq: Once | INTRAMUSCULAR | Status: AC

## 2015-03-28 MED ORDER — SODIUM CHLORIDE 0.9 % IV BOLUS (SEPSIS)
500.0000 mL | Freq: Once | INTRAVENOUS | Status: AC
  Administered 2015-03-28: 500 mL via INTRAVENOUS

## 2015-03-28 MED ORDER — RANITIDINE HCL 150 MG PO CAPS: 150.0000 mg | ORAL_CAPSULE | Freq: Two times a day (BID) | ORAL | Status: DC

## 2015-03-28 MED ORDER — PREDNISONE 20 MG PO TABS: 20.0000 mg | ORAL_TABLET | Freq: Every day | ORAL | Status: DC

## 2015-03-28 NOTE — ED Notes (Signed)
Pt c/o sudden onset red raised hives all over body with SOB for the past 58min.Marland Kitchen

## 2015-03-28 NOTE — Discharge Instructions (Signed)
Stop taking warfarin and follow up with Dr. Ubaldo Glassing for further consideration of anticoagulation choices for your atrial fibrillation.

## 2015-03-28 NOTE — ED Provider Notes (Signed)
[facility] Emergency Department Provider Note  ____________________________________________  Time seen: 5:55 PM  I have reviewed the triage vital signs and the nursing notes.   HISTORY  Chief Complaint Allergic Reaction    HPI Dustin Acosta. is a 82 y.o. male who complains of an itchy rash all over his body that started about 30 minutes ago. He has been taking Coumadin for the past week for atrial fibrillation but otherwise no new medications foods or other exposures. He last ate at 4:00 PM and just ate chicken. States that he may have felt short of breath initially but he took a oral Benadryl at home and currently has no symptoms other than the skin itching. Denies chest pain or shortness of breath. No vomiting or diarrhea or abdominal pain. No dizziness or syncope. Denies any throat swelling or itching.     Past Medical History  Diagnosis Date  . Coronary artery disease   . Hypertension   . Stroke ([place])   . Dysrhythmia   . Cancer ([place])     skin  . A-fib [facility])      Patient Active Problem List   Diagnosis Date Noted  . 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 Colon surgery    . Coronary angioplasty    . Cardiac catheterization N/A 08/06/2014    Procedure: Left Heart Cath and Coronary Angiography;  Surgeon: Charolette Forward, MD;  Location: [place] [facility];  Service: Cardiovascular;  Laterality: N/A;     Current Outpatient Rx  Name  Route  Sig  Dispense  Refill  . aspirin EC 81 MG tablet   Oral   Take 81 mg by mouth daily.         Marland Kitchen atorvastatin (LIPITOR) 40 MG tablet   Oral   Take 1 tablet (40 mg total) by mouth daily at 6 PM.   30 tablet   3   . clopidogrel (PLAVIX) 75 MG tablet   Oral   Take 1 tablet (75 mg total) by mouth daily.   30 tablet   3   . diphenhydrAMINE (BENADRYL) 25 mg capsule   Oral   Take 1 capsule (25 mg total) by mouth every 6 (six)  hours as needed.   12 capsule   0   . EPINEPHrine 0.3 mg/0.3 mL IJ SOAJ injection   Intramuscular   Inject 0.3 mLs (0.3 mg total) into the muscle once. Follow package instructions as needed for severe allergy or anaphylactic reaction.   1 Device   2   . metoprolol succinate (TOPROL-XL) 50 MG 24 hr tablet   Oral   Take 1 tablet (50 mg total) by mouth daily.   30 tablet   3   . Multiple Vitamin (MULTIVITAMIN) tablet   Oral   Take 1 tablet by mouth daily.         . nitroGLYCERIN (NITROSTAT) 0.4 MG SL tablet   Sublingual   Place 1 tablet (0.4 mg total) under the tongue every 5 (five) minutes x 3 doses as needed for chest pain.   25 tablet   12   . pantoprazole (PROTONIX) 40 MG tablet   Oral   Take 1 tablet (40 mg total) by mouth daily at 6 (six) AM.   30 tablet   3   . predniSONE (DELTASONE) 20 MG tablet   Oral   Take 1 tablet (20 mg total) by mouth daily.  3 tablet   0   . ranitidine (ZANTAC) 150 MG capsule   Oral   Take 1 capsule (150 mg total) by mouth 2 (two) times daily.   28 capsule   0   . triamcinolone ointment (KENALOG) 0.1 %   Topical   Apply 1 application topically 2 (two) times daily as needed (for itching).       0      Allergies Review of patient's allergies indicates no known allergies.   No family history on file.  Social History Social History  Substance Use Topics  . Smoking status: Never Smoker   . Smokeless tobacco: None  . Alcohol Use: No    Review of Systems  Constitutional:   No fever or chills. No weight changes Eyes:   No blurry vision or double vision.  ENT:   No sore throat. Cardiovascular:   No chest pain. Respiratory:   No dyspnea or cough. Gastrointestinal:   Negative for abdominal pain, vomiting and diarrhea.  No BRBPR or melena. Genitourinary:   Negative for dysuria, urinary retention, bloody urine, or difficulty urinating. Musculoskeletal:   Negative for back pain. No joint swelling or pain. Skin:   Positive  as above with itchy rash diffusely. Neurological:   Negative for headaches, focal weakness or numbness. Psychiatric:  No anxiety or depression.   Endocrine:  No hot/cold intolerance, changes in energy, or sleep difficulty.  10-point ROS otherwise negative.  ____________________________________________   PHYSICAL EXAM:  VITAL SIGNS: ED Triage Vitals  Enc Vitals Group     BP --      Pulse --      Resp --      Temp --      Temp src --      SpO2 --      Weight --      Height --      Head Cir --      Peak Flow --      Pain Score --      Pain Loc --      Pain Edu? --      Excl. in [place]? --     Vital signs reviewed, nursing assessments reviewed.   Constitutional:   Alert and oriented. Well appearing and in no distress. Eyes:   No scleral icterus. No conjunctival pallor. PERRL. EOMI ENT   Head:   Normocephalic and atraumatic.   Nose:   No congestion/rhinnorhea. No septal hematoma   Mouth/Throat:   MMM, no pharyngeal erythema. No peritonsillar mass. No uvula shift. No mucous membrane involvement   Neck:   No stridor. No SubQ emphysema. No meningismus. Hematological/Lymphatic/Immunilogical:   No cervical lymphadenopathy. Cardiovascular:   Irregularly irregular rhythm with heart rate of 110. Normal and symmetric distal pulses are present in all extremities. Positive systolic murmur. Respiratory:   Normal respiratory effort without tachypnea nor retractions. Breath sounds are clear and equal bilaterally. No wheezes/rales/rhonchi. Gastrointestinal:   Soft and nontender. No distention. There is no CVA tenderness.  No rebound, rigidity, or guarding. Normal bowel sounds Genitourinary:   deferred Musculoskeletal:   Nontender with normal range of motion in all extremities. No joint effusions.  No lower extremity tenderness.  No edema. Neurologic:   Normal speech and language.  CN 2-10 normal. Motor grossly intact. No pronator drift.  Normal gait. No gross focal  neurologic deficits are appreciated.  Skin:    Skin is warm, dry and intact. Diffuse irregular macular blanching erythematous confluent rash on all extremities  and the trunk. Face is relatively spared..  No petechiae, purpura, or bullae. Psychiatric:   Mood and affect are normal. Speech and behavior are normal. Patient exhibits appropriate insight and judgment.  ____________________________________________    LABS (pertinent positives/negatives) (all labs ordered are listed, but only abnormal results are displayed) Labs Reviewed  BASIC METABOLIC PANEL - Abnormal; Notable for the following:    Chloride 98 (*)    Glucose, Bld 197 (*)    GFR calc non Af Amer 51 (*)    GFR calc Af Amer 59 (*)    All other components within normal limits  PROTIME-INR - Abnormal; Notable for the following:    Prothrombin Time 21.4 (*)    All other components within normal limits  CBC WITH DIFFERENTIAL/PLATELET   ____________________________________________    EKG    ____________________________________________    RADIOLOGY    ____________________________________________   PROCEDURES   ____________________________________________   INITIAL IMPRESSION / ASSESSMENT AND PLAN / ED COURSE  Pertinent labs & imaging results that were available during my care of the patient were reviewed by me and considered in my medical decision making (see chart for details).  Patient complains of diffuse rash which may be due to warfarin that he recently started about a week ago. No other medication changes. No other known exposures. Symptoms and exam do not suggest anaphylaxis at this time. We'll give steroids and antihistamines and monitor in the emergency department while checking some labs including his PT. Otherwise well-appearing without any other indication of acute illness.  He follows up with Dr. Ola Spurr and his cardiologist is Dr. Ubaldo Glassing.    ----------------------------------------- 8:15 PM on  03/28/2015 -----------------------------------------  Patient feels much better. Repeat exam shows that the skin rash has completely resolved. Vital signs are stable and normal. He feels back to baseline. I discussed the case with Dr. Clayborn Bigness his covering for Dr. Ubaldo Glassing who recommends discontinuing warfarin and not restarting any anticoagulation at this time until he follows up with Dr. Ubaldo Glassing in clinic. I'll keep the patient on antihistamines and low-dose steroid and prescribed an EpiPen as needed in the meantime.   ____________________________________________   FINAL CLINICAL IMPRESSION(S) / ED DIAGNOSES  Final diagnoses:  Allergic reaction, initial encounter      Carrie Mew, MD 03/28/15 2016

## 2017-09-03 DIAGNOSIS — Z862 Personal history of diseases of the blood and blood-forming organs and certain disorders involving the immune mechanism: Secondary | ICD-10-CM | POA: Insufficient documentation

## 2018-04-13 ENCOUNTER — Encounter: Payer: Self-pay | Admitting: *Deleted

## 2018-04-21 ENCOUNTER — Ambulatory Visit: Payer: Medicare Other | Admitting: Certified Registered"

## 2018-04-21 ENCOUNTER — Other Ambulatory Visit: Payer: Self-pay

## 2018-04-21 ENCOUNTER — Ambulatory Visit
Admission: RE | Admit: 2018-04-21 | Discharge: 2018-04-21 | Disposition: A | Discharge status: Home / Self Care | Payer: Medicare Other | Source: Ambulatory Visit | Attending: Ophthalmology | Admitting: Ophthalmology

## 2018-04-21 ENCOUNTER — Encounter
Admission: RE | Disposition: A | Discharge status: Home / Self Care | Payer: Self-pay | Source: Ambulatory Visit | Attending: Ophthalmology

## 2018-04-21 DIAGNOSIS — I252 Old myocardial infarction: Secondary | ICD-10-CM | POA: Insufficient documentation

## 2018-04-21 DIAGNOSIS — H2512 Age-related nuclear cataract, left eye: Secondary | ICD-10-CM | POA: Insufficient documentation

## 2018-04-21 DIAGNOSIS — I499 Cardiac arrhythmia, unspecified: Secondary | ICD-10-CM | POA: Diagnosis not present

## 2018-04-21 DIAGNOSIS — I10 Essential (primary) hypertension: Secondary | ICD-10-CM | POA: Diagnosis not present

## 2018-04-21 DIAGNOSIS — Z8709 Personal history of other diseases of the respiratory system: Secondary | ICD-10-CM | POA: Insufficient documentation

## 2018-04-21 DIAGNOSIS — Z8673 Personal history of transient ischemic attack (TIA), and cerebral infarction without residual deficits: Secondary | ICD-10-CM | POA: Insufficient documentation

## 2018-04-21 DIAGNOSIS — I4891 Unspecified atrial fibrillation: Secondary | ICD-10-CM | POA: Insufficient documentation

## 2018-04-21 DIAGNOSIS — R011 Cardiac murmur, unspecified: Secondary | ICD-10-CM | POA: Diagnosis not present

## 2018-04-21 DIAGNOSIS — Z955 Presence of coronary angioplasty implant and graft: Secondary | ICD-10-CM | POA: Insufficient documentation

## 2018-04-21 DIAGNOSIS — I2510 Atherosclerotic heart disease of native coronary artery without angina pectoris: Secondary | ICD-10-CM | POA: Diagnosis not present

## 2018-04-21 DIAGNOSIS — M7989 Other specified soft tissue disorders: Secondary | ICD-10-CM | POA: Insufficient documentation

## 2018-04-21 HISTORY — DX: Cardiac murmur, unspecified: R01.1

## 2018-04-21 HISTORY — PX: CATARACT EXTRACTION W/PHACO: SHX586

## 2018-04-21 HISTORY — DX: Acute myocardial infarction, unspecified: I21.9

## 2018-04-21 HISTORY — DX: Edema, unspecified: R60.9

## 2018-04-21 SURGERY — PHACOEMULSIFICATION, CATARACT, WITH IOL INSERTION
Anesthesia: Monitor Anesthesia Care | Site: Eye | Laterality: Left

## 2018-04-21 MED ORDER — ARMC OPHTHALMIC DILATING DROPS
OPHTHALMIC | Status: AC
  Administered 2018-04-21: 1 via OPHTHALMIC
  Filled 2018-04-21: qty 0.5

## 2018-04-21 MED ORDER — CARBACHOL 0.01 % IO SOLN
INTRAOCULAR | Status: DC | PRN
  Administered 2018-04-21: 0.5 mL via INTRAOCULAR

## 2018-04-21 MED ORDER — HYALURONIDASE HUMAN 150 UNIT/ML IJ SOLN
INTRAMUSCULAR | Status: AC
  Filled 2018-04-21: qty 1

## 2018-04-21 MED ORDER — EPINEPHRINE PF 1 MG/ML IJ SOLN
INTRAMUSCULAR | Status: AC
  Filled 2018-04-21: qty 1

## 2018-04-21 MED ORDER — EPINEPHRINE PF 1 MG/ML IJ SOLN
INTRAOCULAR | Status: DC | PRN
  Administered 2018-04-21: 13:00:00 via OPHTHALMIC

## 2018-04-21 MED ORDER — NA CHONDROIT SULF-NA HYALURON 40-17 MG/ML IO SOLN
INTRAOCULAR | Status: DC | PRN
  Administered 2018-04-21: 1 mL via INTRAOCULAR

## 2018-04-21 MED ORDER — FENTANYL CITRATE (PF) 100 MCG/2ML IJ SOLN
INTRAMUSCULAR | Status: AC
  Filled 2018-04-21: qty 2

## 2018-04-21 MED ORDER — BUPIVACAINE HCL (PF) 0.75 % IJ SOLN
INTRAMUSCULAR | Status: AC
  Filled 2018-04-21: qty 10

## 2018-04-21 MED ORDER — SODIUM CHLORIDE 0.9 % IV SOLN
INTRAVENOUS | Status: DC
  Administered 2018-04-21: 12:00:00 via INTRAVENOUS

## 2018-04-21 MED ORDER — POVIDONE-IODINE 5 % OP SOLN
OPHTHALMIC | Status: DC | PRN
  Administered 2018-04-21: 1 via OPHTHALMIC

## 2018-04-21 MED ORDER — TETRACAINE HCL 0.5 % OP SOLN
OPHTHALMIC | Status: AC
  Administered 2018-04-21: 1 [drp] via OPHTHALMIC
  Filled 2018-04-21: qty 4

## 2018-04-21 MED ORDER — POVIDONE-IODINE 5 % OP SOLN
OPHTHALMIC | Status: AC
  Filled 2018-04-21: qty 30

## 2018-04-21 MED ORDER — ARMC OPHTHALMIC DILATING DROPS
1.0000 "application " | OPHTHALMIC | Status: AC
  Administered 2018-04-21 (×3): 1 via OPHTHALMIC

## 2018-04-21 MED ORDER — FENTANYL CITRATE (PF) 100 MCG/2ML IJ SOLN
INTRAMUSCULAR | Status: DC | PRN
  Administered 2018-04-21: 12.5 ug via INTRAVENOUS
  Administered 2018-04-21: 25 ug via INTRAVENOUS
  Administered 2018-04-21: 12.5 ug via INTRAVENOUS

## 2018-04-21 MED ORDER — TRYPAN BLUE 0.06 % OP SOLN
OPHTHALMIC | Status: DC | PRN
  Administered 2018-04-21: 0.5 mL via INTRAOCULAR

## 2018-04-21 MED ORDER — MOXIFLOXACIN HCL 0.5 % OP SOLN
OPHTHALMIC | Status: AC
  Filled 2018-04-21: qty 3

## 2018-04-21 MED ORDER — LIDOCAINE HCL (PF) 4 % IJ SOLN
INTRAOCULAR | Status: DC | PRN
  Administered 2018-04-21: 4 mL via OPHTHALMIC

## 2018-04-21 MED ORDER — LIDOCAINE HCL (PF) 4 % IJ SOLN
INTRAMUSCULAR | Status: AC
  Filled 2018-04-21: qty 5

## 2018-04-21 MED ORDER — TETRACAINE HCL 0.5 % OP SOLN
1.0000 [drp] | OPHTHALMIC | Status: AC | PRN
  Administered 2018-04-21 (×3): 1 [drp] via OPHTHALMIC

## 2018-04-21 MED ORDER — MOXIFLOXACIN HCL 0.5 % OP SOLN
OPHTHALMIC | Status: DC | PRN
  Administered 2018-04-21: 0.2 mL via OPHTHALMIC

## 2018-04-21 MED ORDER — MOXIFLOXACIN HCL 0.5 % OP SOLN: 1.0000 [drp] | OPHTHALMIC | Status: DC | PRN

## 2018-04-21 MED ORDER — NA CHONDROIT SULF-NA HYALURON 40-17 MG/ML IO SOLN
INTRAOCULAR | Status: AC
  Filled 2018-04-21: qty 1

## 2018-04-21 SURGICAL SUPPLY — 16 items
GLOVE BIO SURGEON STRL SZ8 (GLOVE) ×3 IMPLANT
GLOVE BIOGEL M 6.5 STRL (GLOVE) ×3 IMPLANT
GLOVE SURG LX 8.0 MICRO (GLOVE) ×2
GLOVE SURG LX STRL 8.0 MICRO (GLOVE) ×1 IMPLANT
GOWN STRL REUS W/ TWL LRG LVL3 (GOWN DISPOSABLE) ×2 IMPLANT
GOWN STRL REUS W/TWL LRG LVL3 (GOWN DISPOSABLE) ×4
LABEL CATARACT MEDS ST (LABEL) ×3 IMPLANT
LENS IOL ACRYSOF IQ 22.0 (Intraocular Lens) ×3 IMPLANT
PACK CATARACT (MISCELLANEOUS) ×3 IMPLANT
PACK CATARACT BRASINGTON LX (MISCELLANEOUS) ×3 IMPLANT
PACK EYE AFTER SURG (MISCELLANEOUS) ×3 IMPLANT
SOL BSS BAG (MISCELLANEOUS) ×3
SOLUTION BSS BAG (MISCELLANEOUS) ×1 IMPLANT
SYR 5ML LL (SYRINGE) ×3 IMPLANT
WATER STERILE IRR 250ML POUR (IV SOLUTION) ×3 IMPLANT
WIPE NON LINTING 3.25X3.25 (MISCELLANEOUS) ×3 IMPLANT

## 2018-04-21 NOTE — H&P (Signed)
All labs reviewed. Abnormal studies sent to patients PCP when indicated.  Previous H&P reviewed, patient examined, there are NO CHANGES.  Dustin Acosta Porfilio2/4/20201:11 PM

## 2018-04-21 NOTE — Transfer of Care (Signed)
Immediate Anesthesia Transfer of Care Note  Patient: Dustin Acosta.  Procedure(s) Performed: CATARACT EXTRACTION PHACO AND INTRAOCULAR LENS PLACEMENT (IOC) LEFT (Left Eye)  Patient Location: PACU  Anesthesia Type:MAC  Level of Consciousness: awake  Airway & Oxygen Therapy: Patient Spontanous Breathing  Post-op Assessment: Report given to RN and Post -op Vital signs reviewed and stable  Post vital signs: stable  Last Vitals:  Vitals Value Taken Time  BP    Temp    Pulse    Resp    SpO2      Last Pain:  Vitals:   04/21/18 1158  TempSrc: Oral  PainSc: 0-No pain         Complications: No apparent anesthesia complications

## 2018-04-21 NOTE — Op Note (Signed)
PREOPERATIVE DIAGNOSIS:  Nuclear sclerotic cataract of the left eye.   POSTOPERATIVE DIAGNOSIS:  Nuclear sclerotic cataract of the left eye.   OPERATIVE PROCEDURE: Procedure(s): CATARACT EXTRACTION PHACO AND INTRAOCULAR LENS PLACEMENT (IOC) LEFT   SURGEON:  Birder Robson, MD.   ANESTHESIA:  Anesthesiologist: Emmie Niemann, MD CRNA: Lavone Orn, CRNA  1.      Managed anesthesia care. 2.     0.61ml of Shugarcaine was instilled following the paracentesis   COMPLICATIONS:  None.   TECHNIQUE:   Stop and chop   DESCRIPTION OF PROCEDURE:  The patient was examined and consented in the preoperative holding area where the aforementioned topical anesthesia was applied to the left eye and then brought back to the Operating Room where the left eye was prepped and draped in the usual sterile ophthalmic fashion and a lid speculum was placed. A paracentesis was created with the side port blade and the anterior chamber was filled with viscoelastic. A near clear corneal incision was performed with the steel keratome. A continuous curvilinear capsulorrhexis was performed with a cystotome followed by the capsulorrhexis forceps. Hydrodissection and hydrodelineation were carried out with BSS on a blunt cannula. The lens was removed in a stop and chop  technique and the remaining cortical material was removed with the irrigation-aspiration handpiece. The capsular bag was inflated with viscoelastic and the Technis [id number] lens was placed in the capsular bag without complication. The remaining viscoelastic was removed from the eye with the irrigation-aspiration handpiece. The wounds were hydrated. The anterior chamber was flushed with Miostat and the eye was inflated to physiologic pressure. 0.75ml Vigamox was placed in the anterior chamber. The wounds were found to be water tight. The eye was dressed with Vigamox. The patient was given protective glasses to wear throughout the day and a shield with which  to sleep tonight. The patient was also given drops with which to begin a drop regimen today and will follow-up with me in one day. Implant Name Type Inv. Item Serial No. Manufacturer Lot No. LRB No. Used  LENS IOL ACRYSOF IQ 22.0 - [id number] Intraocular Lens LENS IOL ACRYSOF IQ 22.0 [id number] ALCON  Left 1    Procedure(s) with comments: CATARACT EXTRACTION PHACO AND INTRAOCULAR LENS PLACEMENT (IOC) LEFT (Left) - [place] 01:50.9 CDE 17.73 Fluid Pack Lot # [id number]  Electronically signed: Birder Robson 04/21/2018 1:58 PM

## 2018-04-21 NOTE — Discharge Instructions (Signed)
Eye Surgery Discharge Instructions    Expect mild scratchy sensation or mild soreness. DO NOT RUB YOUR EYE!  The day of surgery:  Minimal physical activity, but bed rest is not required  No reading, computer work, or close hand work  No bending, lifting, or straining.  May watch TV  For 24 hours:  No driving, legal decisions, or alcoholic beverages  Safety precautions  Eat anything you prefer: It is better to start with liquids, then soup then solid foods.  _____ Eye patch should be worn until postoperative exam tomorrow.  ____ Solar shield eyeglasses should be worn for comfort in the sunlight/patch while sleeping  Resume all regular medications including aspirin or Coumadin if these were discontinued prior to surgery. You may shower, bathe, shave, or wash your hair. Tylenol may be taken for mild discomfort.  Call your doctor if you experience significant pain, nausea, or vomiting, fever > 101 or other signs of infection. [phone] or [phone] Specific instructions:  Follow-up Information    Birder Robson, MD Follow up on 04/22/2018.   Specialty:  Ophthalmology Why:  10:15 Contact information: [address] [place] [address] [phone]

## 2018-04-21 NOTE — Anesthesia Preprocedure Evaluation (Signed)
Anesthesia Evaluation  Patient identified by MRN, date of birth, ID band Patient awake    Reviewed: Allergy & Precautions, NPO status , Patient's Chart, lab work & pertinent test results  History of Anesthesia Complications Negative for: history of anesthetic complications  Airway Mallampati: III  TM Distance: >3 FB Neck ROM: Full    Dental no notable dental hx.    Pulmonary neg pulmonary ROS, neg sleep apnea, neg COPD,    breath sounds clear to auscultation- rhonchi (-) wheezing      Cardiovascular hypertension, Pt. on medications + CAD, + Past MI and + Cardiac Stents  + dysrhythmias Atrial Fibrillation  Rhythm:Regular Rate:Normal - Systolic murmurs and - Diastolic murmurs    Neuro/Psych neg Seizures CVA negative psych ROS   GI/Hepatic negative GI ROS, Neg liver ROS,   Endo/Other  negative endocrine ROSneg diabetes  Renal/GU negative Renal ROS     Musculoskeletal negative musculoskeletal ROS (+)   Abdominal (+) - obese,   Peds  Hematology negative hematology ROS (+)   Anesthesia Other Findings Past Medical History: No date: A-fib ([facility]) No date: Cancer ([place])     Comment:  skin / COLON No date: Coronary artery disease No date: Dysrhythmia No date: Edema     Comment:  FEET/LEGS No date: Heart murmur No date: Hypertension No date: Myocardial infarction ([place]) No date: Stroke ([facility])     Comment:  X 2   Reproductive/Obstetrics                             Anesthesia Physical Anesthesia Plan  ASA: III  Anesthesia Plan: MAC   Post-op Pain Management:    Induction: Intravenous  PONV Risk Score and Plan: 1 and Midazolam  Airway Management Planned: Natural Airway  Additional Equipment:   Intra-op Plan:   Post-operative Plan:   Informed Consent: I have reviewed the patients History and Physical, chart, labs and discussed the procedure including the risks, benefits and  alternatives for the proposed anesthesia with the patient or authorized representative who has indicated his/her understanding and acceptance.       Plan Discussed with: CRNA and Anesthesiologist  Anesthesia Plan Comments:         Anesthesia Quick Evaluation

## 2018-04-21 NOTE — Anesthesia Post-op Follow-up Note (Signed)
Anesthesia QCDR form completed.        

## 2018-04-21 NOTE — Anesthesia Postprocedure Evaluation (Signed)
Anesthesia Post Note  Patient: Dustin Acosta.  Procedure(s) Performed: CATARACT EXTRACTION PHACO AND INTRAOCULAR LENS PLACEMENT (IOC) LEFT (Left Eye)  Patient location during evaluation: PACU Anesthesia Type: MAC Level of consciousness: awake and alert and oriented Pain management: pain level controlled Vital Signs Assessment: post-procedure vital signs reviewed and stable Respiratory status: spontaneous breathing, nonlabored ventilation and respiratory function stable Cardiovascular status: blood pressure returned to baseline and stable Postop Assessment: no signs of nausea or vomiting Anesthetic complications: no     Last Vitals:  Vitals:   04/21/18 1400 04/21/18 1412  BP: (!) 141/76 136/80  Pulse: 76 70  Resp: 16 16  Temp: (!) 36.4 C   SpO2: 99% 95%    Last Pain:  Vitals:   04/21/18 1412  TempSrc:   PainSc: 0-No pain                 Anthoni Geerts

## 2018-07-16 ENCOUNTER — Other Ambulatory Visit: Payer: Self-pay

## 2018-07-16 ENCOUNTER — Emergency Department: Payer: Medicare Other

## 2018-07-16 ENCOUNTER — Emergency Department
Admission: EM | Admit: 2018-07-16 | Discharge: 2018-07-16 | Disposition: A | Discharge status: Home / Self Care | Payer: Medicare Other | Attending: Emergency Medicine | Admitting: Emergency Medicine

## 2018-07-16 ENCOUNTER — Encounter: Payer: Self-pay | Admitting: Emergency Medicine

## 2018-07-16 DIAGNOSIS — Z85038 Personal history of other malignant neoplasm of large intestine: Secondary | ICD-10-CM | POA: Diagnosis not present

## 2018-07-16 DIAGNOSIS — I2510 Atherosclerotic heart disease of native coronary artery without angina pectoris: Secondary | ICD-10-CM | POA: Insufficient documentation

## 2018-07-16 DIAGNOSIS — I4891 Unspecified atrial fibrillation: Secondary | ICD-10-CM | POA: Insufficient documentation

## 2018-07-16 DIAGNOSIS — Z79899 Other long term (current) drug therapy: Secondary | ICD-10-CM | POA: Diagnosis not present

## 2018-07-16 DIAGNOSIS — Z7982 Long term (current) use of aspirin: Secondary | ICD-10-CM | POA: Insufficient documentation

## 2018-07-16 DIAGNOSIS — R109 Unspecified abdominal pain: Secondary | ICD-10-CM | POA: Insufficient documentation

## 2018-07-16 DIAGNOSIS — I10 Essential (primary) hypertension: Secondary | ICD-10-CM | POA: Insufficient documentation

## 2018-07-16 LAB — CBC WITH DIFFERENTIAL/PLATELET
Abs Immature Granulocytes: 0.02 10*3/uL (ref 0.00–0.07)
Basophils Absolute: 0 10*3/uL (ref 0.0–0.1)
Basophils Relative: 0 %
Eosinophils Absolute: 0.1 10*3/uL (ref 0.0–0.5)
Eosinophils Relative: 1 %
HCT: 40.6 % (ref 39.0–52.0)
Hemoglobin: 13.6 g/dL (ref 13.0–17.0)
Immature Granulocytes: 0 %
Lymphocytes Relative: 18 %
Lymphs Abs: 1.4 10*3/uL (ref 0.7–4.0)
MCH: 32.6 pg (ref 26.0–34.0)
MCHC: 33.5 g/dL (ref 30.0–36.0)
MCV: 97.4 fL (ref 80.0–100.0)
Monocytes Absolute: 1.1 10*3/uL — ABNORMAL HIGH (ref 0.1–1.0)
Monocytes Relative: 15 %
Neutro Abs: 5 10*3/uL (ref 1.7–7.7)
Neutrophils Relative %: 66 %
Platelets: 95 10*3/uL — ABNORMAL LOW (ref 150–400)
RBC: 4.17 MIL/uL — ABNORMAL LOW (ref 4.22–5.81)
RDW: 15.3 % (ref 11.5–15.5)
WBC: 7.6 10*3/uL (ref 4.0–10.5)
nRBC: 0 % (ref 0.0–0.2)

## 2018-07-16 LAB — COMPREHENSIVE METABOLIC PANEL
ALT: 19 U/L (ref 0–44)
AST: 34 U/L (ref 15–41)
Albumin: 4 g/dL (ref 3.5–5.0)
Alkaline Phosphatase: 171 U/L — ABNORMAL HIGH (ref 38–126)
Anion gap: 9 (ref 5–15)
BUN: 14 mg/dL (ref 8–23)
CO2: 29 mmol/L (ref 22–32)
Calcium: 9.2 mg/dL (ref 8.9–10.3)
Chloride: 95 mmol/L — ABNORMAL LOW (ref 98–111)
Creatinine, Ser: 0.66 mg/dL (ref 0.61–1.24)
GFR calc Af Amer: >60 mL/min (ref >60)
GFR calc non Af Amer: >60 mL/min (ref >60)
Glucose, Bld: 107 mg/dL — ABNORMAL HIGH (ref 70–99)
Potassium: 3.5 mmol/L (ref 3.5–5.1)
Sodium: 133 mmol/L — ABNORMAL LOW (ref 135–145)
Total Bilirubin: 1.8 mg/dL — ABNORMAL HIGH (ref 0.3–1.2)
Total Protein: 7.6 g/dL (ref 6.5–8.1)

## 2018-07-16 LAB — URINALYSIS, COMPLETE (UACMP) WITH MICROSCOPIC
Bacteria, UA: NONE SEEN
Bilirubin Urine: NEGATIVE
Glucose, UA: NEGATIVE mg/dL
Hgb urine dipstick: NEGATIVE
Ketones, ur: NEGATIVE mg/dL
Leukocytes,Ua: NEGATIVE
Nitrite: NEGATIVE
Protein, ur: NEGATIVE mg/dL
Specific Gravity, Urine: 1.014 (ref 1.005–1.030)
Squamous Epithelial / HPF: NONE SEEN (ref 0–5)
pH: 6 (ref 5.0–8.0)

## 2018-07-16 LAB — LIPASE, BLOOD: Lipase: 26 U/L (ref 11–51)

## 2018-07-16 MED ORDER — IOHEXOL 350 MG/ML SOLN
75.0000 mL | Freq: Once | INTRAVENOUS | Status: AC | PRN
  Administered 2018-07-16: 60 mL via INTRAVENOUS

## 2018-07-16 MED ORDER — ACETAMINOPHEN 500 MG PO TABS
1000.0000 mg | ORAL_TABLET | Freq: Once | ORAL | Status: AC
  Administered 2018-07-16: 1000 mg via ORAL
  Filled 2018-07-16: qty 2

## 2018-07-16 MED ORDER — OXYCODONE HCL 5 MG PO TABS
5.0000 mg | ORAL_TABLET | Freq: Once | ORAL | Status: AC
  Administered 2018-07-16: 5 mg via ORAL
  Filled 2018-07-16: qty 1

## 2018-07-16 MED ORDER — MORPHINE SULFATE (PF) 4 MG/ML IV SOLN
4.0000 mg | Freq: Once | INTRAVENOUS | Status: AC
  Administered 2018-07-16: 4 mg via INTRAVENOUS

## 2018-07-16 MED ORDER — METOPROLOL SUCCINATE ER 50 MG PO TB24
50.0000 mg | ORAL_TABLET | Freq: Once | ORAL | Status: AC
  Administered 2018-07-16: 50 mg via ORAL
  Filled 2018-07-16: qty 1

## 2018-07-16 MED ORDER — ONDANSETRON HCL 4 MG/2ML IJ SOLN
4.0000 mg | Freq: Once | INTRAMUSCULAR | Status: AC
  Administered 2018-07-16: 4 mg via INTRAVENOUS
  Filled 2018-07-16: qty 2

## 2018-07-16 MED ORDER — IOHEXOL 300 MG/ML  SOLN
100.0000 mL | Freq: Once | INTRAMUSCULAR | Status: AC | PRN
  Administered 2018-07-16: 100 mL via INTRAVENOUS

## 2018-07-16 MED ORDER — FENTANYL CITRATE (PF) 100 MCG/2ML IJ SOLN
50.0000 ug | Freq: Once | INTRAMUSCULAR | Status: AC
  Administered 2018-07-16: 16:00:00 50 ug via INTRAVENOUS
  Filled 2018-07-16: qty 2

## 2018-07-16 MED ORDER — OXYCODONE HCL 5 MG PO TABS: 5.0000 mg | ORAL_TABLET | Freq: Three times a day (TID) | ORAL | 0 refills | Status: AC | PRN

## 2018-07-16 MED ORDER — MORPHINE SULFATE (PF) 4 MG/ML IV SOLN
INTRAVENOUS | Status: AC
  Filled 2018-07-16: qty 1

## 2018-07-16 NOTE — ED Notes (Signed)
Hassan Rowan (daughter) [phone] - she request that she be called with any updates AND she will be the one to call to pick pt up

## 2018-07-16 NOTE — ED Notes (Signed)
Patient returned from [place]. Patient is laying in bed with no complaints at this time. Will continue to monitor.

## 2018-07-16 NOTE — ED Notes (Addendum)
20 years ago had a similar pain and had part of his intestines taken out- sharp severe pain in the lower right side of his abdomen that started last night and gets worse with movement

## 2018-07-16 NOTE — ED Notes (Signed)
Pt c/o 10/10 pain in right lower/mid flank - Dr Alfred Levins notified and VO given for morphine 4mg  IV

## 2018-07-16 NOTE — ED Triage Notes (Signed)
Pt states the pain is sharp in his RLQ. Pt states he still has appendix.

## 2018-07-16 NOTE — ED Triage Notes (Signed)
Pt reports abd pain since yesterday, sharp in nature. Denies NVD.

## 2018-07-16 NOTE — ED Provider Notes (Signed)
[facility] Emergency Department Provider Note  ____________________________________________  Time seen: Approximately 3:50 PM  I have reviewed the triage vital signs and the nursing notes.   HISTORY  Chief Complaint Abdominal Pain   HPI Dustin Acosta. is a 85 y.o. male with a history of colon cancer status post partial colectomy in 2010, CAD, atrial fibrillation, hypertension, CVA on Plavix who presents for evaluation of abdominal pain.  Patient reports that the pain started yesterday evening.  Sharp, located in the right side of his abdomen, constant, currently 8 out of 10.  The pain is nonradiating.  He denies nausea, vomiting, diarrhea, constipation, dysuria, hematuria, chest pain or shortness of breath.  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    Past Surgical History:  Procedure Laterality Date   CARDIAC CATHETERIZATION N/A 08/06/2014   Procedure: Left Heart Cath and Coronary Angiography;  Surgeon: Charolette Forward, MD;  Location: [facility];  Service: Cardiovascular;  Laterality: N/A;   CATARACT EXTRACTION W/PHACO Left 04/21/2018   Procedure: CATARACT EXTRACTION PHACO AND INTRAOCULAR LENS PLACEMENT ([place]) LEFT;  Surgeon: Birder Robson, MD;  Location: [facility] ORS;  Service: Ophthalmology;  Laterality: Left;  [place] 01:50.9 CDE 17.73 Fluid Pack Lot # [id number]   COLON SURGERY     CORONARY ANGIOPLASTY      Prior to Admission medications   Medication Sig Start Date End Date Taking? Authorizing Provider  aspirin EC 81 MG tablet Take 81 mg by mouth daily.   Yes [provider]  diphenhydrAMINE (BENADRYL) 25 mg capsule  Take 1 capsule (25 mg total) by mouth every 6 (six) hours as needed. 03/28/15  Yes Carrie Mew, MD  furosemide (LASIX) 20 MG tablet Take 40 mg by mouth daily. 02/16/18  Yes [provider]  metoprolol succinate (TOPROL-XL) 50 MG 24 hr tablet Take 1 tablet (50 mg total) by mouth daily. 08/09/14  Yes Charolette Forward, MD  Multiple Vitamin (MULTIVITAMIN) tablet Take 1 tablet by mouth daily.   Yes [provider]  potassium chloride SA (K-DUR) 20 MEQ tablet Take 20 mEq by mouth daily. 05/26/18 05/26/19 Yes [provider]  vitamin B-12 (CYANOCOBALAMIN) 1000 MCG tablet Take 1,000 mcg by mouth daily. 09/03/17 09/03/18 Yes [provider]  atorvastatin (LIPITOR) 40 MG tablet Take 1 tablet (40 mg total) by mouth daily at 6 PM. Patient not taking: Reported on 04/21/2018 08/09/14   Charolette Forward, MD  augmented betamethasone dipropionate (DIPROLENE-AF) 0.05 % ointment Apply 1 application topically 2 (two) times daily. 07/03/18   [provider]  clopidogrel (PLAVIX) 75 MG tablet Take 1 tablet (75 mg total) by mouth daily. Patient not taking: Reported on 07/16/2018 08/09/14   Charolette Forward, MD  EPINEPHrine 0.3 mg/0.3 mL IJ SOAJ injection Inject 0.3 mLs (0.3 mg total) into the muscle once. Follow package instructions as needed for severe allergy or anaphylactic reaction. Patient not taking: Reported on 04/21/2018 03/28/15   Carrie Mew, MD  metolazone (ZAROXOLYN) 5 MG tablet Take 5 mg by mouth daily. 05/18/18 05/18/19  [provider]  nitroGLYCERIN (NITROSTAT) 0.4 MG SL tablet Place 1 tablet (0.4 mg total) under the tongue every 5 (five) minutes x 3 doses as needed for chest pain. 08/09/14   Charolette Forward, MD  oxyCODONE (ROXICODONE) 5 MG immediate release tablet Take 1 tablet (5 mg total) by mouth every 8 (eight) hours as needed. 07/16/18 07/16/19  Rudene Re, MD  pantoprazole (PROTONIX) 40 MG tablet Take 1 tablet (40 mg total) by mouth daily at 6 (six)  AM. Patient not taking: Reported on 04/21/2018 08/09/14   Charolette Forward, MD  predniSONE (DELTASONE) 20 MG tablet Take 1 tablet (20 mg total) by mouth daily. Patient not taking: Reported on 07/16/2018 03/28/15   Carrie Mew, MD  ranitidine (ZANTAC) 150 MG capsule Take 1 capsule (150 mg total) by mouth 2 (two) times daily. Patient not taking: Reported on 04/21/2018 03/28/15   Carrie Mew, MD  triamcinolone ointment (KENALOG) 0.1 % Apply 1 application topically 2 (two) times daily as needed (for itching).  06/23/14   [provider]    Allergies Patient has no known allergies.  No family history on file.  Social History Social History   Tobacco Use   Smoking status: Never Smoker   Smokeless tobacco: Never Used  Substance Use Topics   Alcohol use: No   Drug use: No    Review of Systems  Constitutional: Negative for fever. Eyes: Negative for visual changes. ENT: Negative for sore throat. Neck: No neck pain  Cardiovascular: Negative for chest pain. Respiratory: Negative for shortness of breath. Gastrointestinal: + R sided abdominal pain. No vomiting or diarrhea. Genitourinary: Negative for dysuria. Musculoskeletal: Negative for back pain. Skin: Negative for rash. Neurological: Negative for headaches, weakness or numbness. Psych: No SI or HI  ____________________________________________   PHYSICAL EXAM:  VITAL SIGNS: ED Triage Vitals  Enc Vitals Group     BP 07/16/18 1505 137/87     Pulse Rate 07/16/18 1505 93     Resp --      Temp 07/16/18 1505 97.9 F (36.6 C)     Temp Source 07/16/18 1505 Oral     SpO2 07/16/18 1505 99 %     Weight 07/16/18 1451 160 lb (72.6 kg)     Height 07/16/18 1451 5\' 8"  (1.727 m)     Head Circumference --      Peak Flow --      Pain Score 07/16/18 1451 10     Pain Loc --      Pain Edu? --      Excl. in [place]? --     Constitutional: Alert and oriented. Well appearing and in no apparent distress. HEENT:      Head:  Normocephalic and atraumatic.         Eyes: Conjunctivae are normal. Sclera is non-icteric.       Mouth/Throat: Mucous membranes are moist.       Neck: Supple with no signs of meningismus. Cardiovascular: Regular rate and rhythm. No murmurs, gallops, or rubs. 2+ symmetrical distal pulses are present in all extremities. No JVD. Respiratory: Normal respiratory effort. Lungs are clear to auscultation bilaterally. No wheezes, crackles, or rhonchi.  Gastrointestinal: Soft, tender top palpation on R quadrants, negative Murphy's sign, and non distended with positive bowel sounds. No rebound or guarding. Genitourinary: No CVA tenderness. Musculoskeletal: Nontender with normal range of motion in all extremities. No edema, cyanosis, or erythema of extremities. Neurologic: Normal speech and language. Face is symmetric. Moving all extremities. No gross focal neurologic deficits are appreciated.  Skin: Skin is warm, dry and intact. No rash noted. Psychiatric: Mood and affect are normal. Speech and behavior are normal.  ____________________________________________   LABS (all labs ordered are listed, but only abnormal results are displayed)  Labs Reviewed  CBC WITH DIFFERENTIAL/PLATELET - Abnormal; Notable for the following components:      Result Value   RBC 4.17 (*)    Platelets 95 (*)    Monocytes Absolute 1.1 (*)    All other components within normal limits  COMPREHENSIVE METABOLIC PANEL - Abnormal; Notable for the following components:   Sodium 133 (*)    Chloride 95 (*)    Glucose, Bld 107 (*)    Alkaline Phosphatase 171 (*)    Total Bilirubin 1.8 (*)    All other components within normal limits  URINALYSIS, COMPLETE (UACMP) WITH MICROSCOPIC - Abnormal; Notable for the following components:   Color, Urine YELLOW (*)    APPearance CLEAR (*)    All other components within normal limits  LIPASE, BLOOD   ____________________________________________  EKG  ED ECG REPORT I, Rudene Re, the attending physician, personally viewed and interpreted this ECG.  Atrial fibrillation, rate of 94, normal QTC, right axis deviation, no ST elevations or depressions. ____________________________________________  RADIOLOGY  I have personally reviewed the images performed during this visit and I agree with the Radiologist's read.   Interpretation by Radiologist:  Ct Angio Chest Pe W And/or Wo Contrast  Result Date: 07/16/2018 CLINICAL DATA:  85 year old male with right flank/chest pain. History of colon cancer. EXAM: CT ANGIOGRAPHY CHEST WITH CONTRAST TECHNIQUE: Multidetector CT imaging of the chest was performed using the standard protocol during bolus administration of intravenous contrast. Multiplanar CT image reconstructions and MIPs were obtained to evaluate the vascular anatomy. CONTRAST:  67mL OMNIPAQUE IOHEXOL 350 MG/ML SOLN COMPARISON:  CT Abdomen and Pelvis and abdomen ultrasound earlier today. CT Chest, Abdomen, and Pelvis 05/22/2011. FINDINGS: Cardiovascular: Good contrast bolus timing in the pulmonary arterial tree. No focal filling defect identified in the pulmonary arteries to suggest acute pulmonary embolism. Cardiomegaly has progressed since 2013, moderate to severe. No pericardial effusion. Calcified coronary artery atherosclerosis. Calcified aortic atherosclerosis. Little contrast in the aorta on these images. Mediastinum/Nodes: No mediastinal lymphadenopathy. Moderate to large gastric hiatal hernia is chronic. Mild thyromegaly appears increased since 2013. Lungs/Pleura: Low lung volumes and mild respiratory motion. Atelectatic changes to the major airways which otherwise appear patent. Crowding of lung markings and dependent atelectasis bilaterally. Small 6-7 millimeter nodular area of ground-glass opacity in the left upper lobe is new since 2013 on series 6, image 9. Trace layering pleural fluid. Upper Abdomen: Stable from the CT earlier today. Musculoskeletal: No  thoracic compression fracture identified. No acute osseous abnormality identified. Incidental gynecomastia. Review of the MIP images confirms the above findings. IMPRESSION: 1. No evidence of acute pulmonary embolus. 2. Cardiomegaly has progressed since 2013 and is moderate to severe. No pericardial effusion. 3. Chronic large gastric hiatal hernia. 4. Coronary artery and Aortic atherosclerosis (ICD10-I70.0). 5. Low lung volumes with atelectasis and trace pleural fluid. Small 7 mm Ground-glass opacity in the left upper lobe is new since 2013. Ordinarily follow-up by Chest CT without contrast would be recommended in 3 months to re-evaluate, but further evaluation may be less valuable in this age group (Recommendations for the Management of Subsolid Pulmonary Nodules Detected at CT: A Statement from the [employer] as published in Radiology 2013; 266:304-317. ) Electronically Signed   By: Genevie Ann M.D.   On:  07/16/2018 19:17   Ct Abdomen Pelvis W Contrast  Result Date: 07/16/2018 CLINICAL DATA:  RLQ pain, worse with movement, hx colon ca Pt has his appendix^169mL OMNIPAQUE IOHEXOL 300 MG/ML SOLNAbd pain, unspecified EXAM: CT ABDOMEN AND PELVIS WITH CONTRAST TECHNIQUE: Multidetector CT imaging of the abdomen and pelvis was performed using the standard protocol following bolus administration of intravenous contrast. CONTRAST:  135mL OMNIPAQUE IOHEXOL 300 MG/ML  SOLN COMPARISON:  CT 05/22/2011 FINDINGS: Lower chest: Lung bases are clear. Large hiatal hernia the posterior mediastinum. Hepatobiliary: No focal hepatic lesion. Reflux of contrast into the hepatic veins suggest element of RIGHT heart failure. Gallbladder normal. Pancreas: Pancreas is normal. No ductal dilatation. No pancreatic inflammation. Spleen: Normal spleen Adrenals/urinary tract: Adrenal glands and kidneys are normal. The ureters and bladder normal. Stomach/Bowel: Large hiatal hernia with the near entirety of the stomach above the hemidiaphragms  posterior to the LEFT and RIGHT atria. Duodenum normal. Small bowel normal. Appendix and cecum normal. Ascending transverse colon appear normal. Descending colon normal. Anastomosis mid sigmoid colon without obstruction or inflammation. Vascular/Lymphatic: Abdominal aorta is normal caliber with atherosclerotic calcification. There is no retroperitoneal or periportal lymphadenopathy. No pelvic lymphadenopathy. Reproductive: Prostate normal Other: Multiple venous collaterals in the lower pelvis extending at the base the bladder and prostate. Musculoskeletal: No aggressive osseous lesion. IMPRESSION: 1. No clear explanation for RIGHT lower quadrant pain. 2. Large hiatal hernia with the near entirety of stomach above the diaphragm. Similar to comparison exams. 3. Elements of RIGHT heart failure with reflux of contrast into the hepatic veins. 4. Venous collateralization within the pelvis also may relate to heart failure. Electronically Signed   By: Suzy Bouchard M.D.   On: 07/16/2018 17:01   US Abdomen Limited Ruq  Result Date: 07/16/2018 CLINICAL DATA:  85 year old male with right-sided abdominal pain EXAM: ULTRASOUND ABDOMEN LIMITED RIGHT UPPER QUADRANT COMPARISON:  CT 07/16/2018, 05/22/2011 FINDINGS: Gallbladder: No gallstones or wall thickening visualized. No sonographic Murphy sign noted by sonographer. Common bile duct: Diameter: 4 mm-5 mm Liver: Heterogeneous appearance of liver parenchyma with mildly nodular contour. No focal lesion. Portal vein is patent on color Doppler imaging with normal direction of blood flow towards the liver. Small pleural fluid. IMPRESSION: Unremarkable sonographic survey of the gallbladder. Heterogeneous appearance of liver, potentially related to steatosis or other medical liver disease. Electronically Signed   By: Corrie Mckusick D.O.   On: 07/16/2018 18:24      ____________________________________________   PROCEDURES  Procedure(s) performed: None Procedures Critical  Care performed:  None ____________________________________________   INITIAL IMPRESSION / ASSESSMENT AND PLAN / ED COURSE  85 y.o. male with a history of colon cancer status post partial colectomy in 2010, CAD, atrial fibrillation, hypertension, CVA on Plavix who presents for evaluation of right sided abdominal pain. Patient is well appearing, no distress, has normal vital signs, abdomen is soft with diffuse right-sided tenderness with no rebound or guarding.  Differential diagnosis including appendicitis versus gallbladder disease versus SBO versus recurrence of the cancer.  Will check labs, urinalysis, CT abdomen pelvis.    _________________________ 7:33 PM on 07/16/2018 -----------------------------------------  Labs with no acute findings.  CT of the abdomen and pelvis showing hiatal hernia but no other acute findings that could explain patient's pain.  Patient continued to complain of pain therefore he was sent for right upper quadrant ultrasound which is also negative.  Patient continued to complain of pain and therefore he was sent to a CT Angio of the chest to rule out PE which  was negative.  At this time unclear etiology for the pain.  No rash indicative of shingles.  But with normal labs, normal imaging patient will be discharged home.  He does have an appointment with his primary care doctor tomorrow which I encouraged him to keep.  Patient continues to endorse that the pain is similar to when he had his colon cancer.  I recommended discussing this with his PCP so patient can be referred back to GI for colonoscopy.  In the meantime we will treat his pain with 1000 mg of Tylenol 3 times daily and oxycodone as needed.  I discussed the work-up, follow-up and plan with patient's daughter Hassan Rowan over the phone who is comfortable with this plan.  She will pick patient up and take him to his PCPs appointment tomorrow.  I discussed standard return precautions with patient.   As part of my medical  decision making, I reviewed the following data within the [facility] History obtained from family, Nursing notes reviewed and incorporated, Labs reviewed , EKG interpreted , Old EKG reviewed, Old chart reviewed, Radiograph reviewed , Notes from prior ED visits and [place] Controlled Substance Database    Pertinent labs & imaging results that were available during my care of the patient were reviewed by me and considered in my medical decision making (see chart for details).    ____________________________________________   FINAL CLINICAL IMPRESSION(S) / ED DIAGNOSES  Final diagnoses:  Right sided abdominal pain      NEW MEDICATIONS STARTED DURING THIS VISIT:  ED Discharge Orders         Ordered    oxyCODONE (ROXICODONE) 5 MG immediate release tablet  Every 8 hours PRN     07/16/18 1932           Note:  This document was prepared using Dragon voice recognition software and may include unintentional dictation errors.    Alfred Levins [place], MD 07/16/18 Joen Laura

## 2018-07-16 NOTE — Discharge Instructions (Addendum)
Pain control: Take tylenol 1000mg  every 8 hours. Take 5mg  of oxycodone every 6 hours for breakthrough pain. If you need the oxycodone make sure to take one senokot as well to prevent constipation.  Do not drink alcohol, drive or participate in any other potentially dangerous activities while taking this medication as it may make you sleepy. Do not take this medication with any other sedating medications, either prescription or over-the-counter.   You have been seen in the Emergency Department (ED) for abdominal pain.  Your evaluation did not identify a clear cause of your symptoms but was generally reassuring.  Abdominal pain has many possible causes. Some aren't serious and get better on their own in a few days. Others need more testing and treatment. If your pain continues or gets worse, you need to be rechecked and may need more tests to find out what is wrong. You may need surgery to correct the problem.   Follow up with your doctor in 12-24 hours if you are still having abdominal pain. Otherwise follow up in 1-3 days for a re-check  Don't ignore new symptoms, such as fever, nausea and vomiting, new or worsening abdominal pain, urination problems, bloody diarrhea or bloody stools, black tarry stools, uncontrollable nausea and vomiting, and dizziness. These may be signs of a more serious problem. If you develop any of these you should be seen by your doctor immediately or return to the ED.   How can you care for yourself at home?  Rest until you feel better.  To prevent dehydration, drink plenty of fluids, enough so that your urine is light yellow or clear like water. Choose water and other caffeine-free clear liquids until you feel better. If you have kidney, heart, or liver disease and have to limit fluids, talk with your doctor before you increase the amount of fluids you drink.  If your stomach is upset, eat mild foods, such as rice, dry toast or crackers, bananas, and applesauce. Try eating  several small meals instead of two or three large ones.  Wait until 48 hours after all symptoms have gone away before you have spicy foods, alcohol, and drinks that contain caffeine.  Do not eat foods that are high in fat.  Avoid anti-inflammatory medicines such as aspirin, ibuprofen (Advil, Motrin), and naproxen (Aleve). These can cause stomach upset. Talk to your doctor if you take daily aspirin for another health problem.  When should you call for help?  Call 911 anytime you think you may need emergency care. For example, call if:  You passed out (lost consciousness).  You pass maroon or very bloody stools.  You vomit blood or what looks like coffee grounds.  You have new, severe belly pain.  Call your doctor now or seek immediate medical care if:  Your pain gets worse, especially if it becomes focused in one area of your belly.  You have a new or higher fever.  Your stools are black and look like tar, or they have streaks of blood.  You have unexpected vaginal bleeding.  You have symptoms of a urinary tract infection. These may include:  Pain when you urinate.  Urinating more often than usual.  Blood in your urine. You are dizzy or lightheaded, or you feel like you may faint. Watch closely for changes in your health, and be sure to contact your doctor if:  You are not getting better after 1 day (24 hours).

## 2018-07-16 NOTE — ED Notes (Signed)
Patient has elevated HR at 109 and ha history of same. Patient is asymptomatic. EDP aware.

## 2018-07-16 NOTE — ED Notes (Signed)
Patient transported to CT 

## 2018-12-08 DIAGNOSIS — I739 Peripheral vascular disease, unspecified: Secondary | ICD-10-CM | POA: Insufficient documentation

## 2018-12-09 DIAGNOSIS — D696 Thrombocytopenia, unspecified: Secondary | ICD-10-CM | POA: Insufficient documentation

## 2018-12-15 ENCOUNTER — Other Ambulatory Visit (HOSPITAL_COMMUNITY): Payer: Self-pay | Admitting: Rehabilitative and Restorative Service Providers"

## 2018-12-15 ENCOUNTER — Ambulatory Visit
Admission: RE | Admit: 2018-12-15 | Discharge: 2018-12-15 | Disposition: A | Discharge status: Home / Self Care | Payer: Medicare Other | Source: Ambulatory Visit | Attending: Rehabilitative and Restorative Service Providers" | Admitting: Rehabilitative and Restorative Service Providers"

## 2018-12-15 ENCOUNTER — Other Ambulatory Visit: Payer: Self-pay | Admitting: Rehabilitative and Restorative Service Providers"

## 2018-12-15 ENCOUNTER — Other Ambulatory Visit: Payer: Self-pay

## 2018-12-15 DIAGNOSIS — R51 Headache: Secondary | ICD-10-CM | POA: Insufficient documentation

## 2018-12-15 DIAGNOSIS — R519 Headache, unspecified: Secondary | ICD-10-CM

## 2019-03-08 DIAGNOSIS — I5020 Unspecified systolic (congestive) heart failure: Secondary | ICD-10-CM | POA: Insufficient documentation

## 2019-04-16 ENCOUNTER — Encounter: Payer: Self-pay | Admitting: Urology

## 2019-04-16 ENCOUNTER — Other Ambulatory Visit: Payer: Self-pay

## 2019-04-16 ENCOUNTER — Ambulatory Visit (INDEPENDENT_AMBULATORY_CARE_PROVIDER_SITE_OTHER): Payer: Medicare Other | Admitting: Urology

## 2019-04-16 VITALS — BP 107/70 | HR 103 | Ht 68.0 in | Wt 168.0 lb

## 2019-04-16 DIAGNOSIS — N5089 Other specified disorders of the male genital organs: Secondary | ICD-10-CM | POA: Diagnosis not present

## 2019-04-16 DIAGNOSIS — R310 Gross hematuria: Secondary | ICD-10-CM | POA: Diagnosis not present

## 2019-04-16 DIAGNOSIS — N420 Calculus of prostate: Secondary | ICD-10-CM | POA: Insufficient documentation

## 2019-04-16 DIAGNOSIS — M109 Gout, unspecified: Secondary | ICD-10-CM | POA: Insufficient documentation

## 2019-04-16 DIAGNOSIS — C187 Malignant neoplasm of sigmoid colon: Secondary | ICD-10-CM | POA: Insufficient documentation

## 2019-04-16 DIAGNOSIS — R399 Unspecified symptoms and signs involving the genitourinary system: Secondary | ICD-10-CM | POA: Diagnosis not present

## 2019-04-16 MED ORDER — DOXYCYCLINE HYCLATE 100 MG PO CAPS: 100.0000 mg | ORAL_CAPSULE | Freq: Two times a day (BID) | ORAL | 0 refills | Status: AC

## 2019-04-16 NOTE — Progress Notes (Signed)
04/16/2019 10:06 AM   Dustin Acosta. 09-Sep-1924 [id number]  Referring provider: Baxter Hire, MD [place],  [place] [address]  Chief Complaint  Patient presents with  . Establish Care    HPI: Dustin Acosta is a 86 YO male seen at request of Dr. Edwina Barth for evaluation of scrotal swelling.  He initially saw Dr. Edwina Barth on 01/28/2019 complaining of scrotal swelling.  This was also associated with worsening lower extremity edema and shortness of breath.  His diuretics were increased and on follow-up 11/20 he had noted decreased edema of his lower extremities and scrotum.  At a follow-up visit on 12/22 he was complaining of persistent scrotal edema and had requested urology referral stating this had happened many years ago and his scrotum was drained and he was put on antibiotics for an infection.  He was seen on 12/29 complaining of gross hematuria with frequency, dysuria and nocturia.  Urinalysis had microhematuria, pyuria and urine culture grew greater than 100,000 E. coli.  He received an injection of ceftriaxone and was treated with Ceftin for 7 days.  Symptoms improved though he returned on 1/24 complaining of dysuria.  Urinalysis again showed >50 WBC/RBC.  Urine culture was again positive for E. coli and he was started on a 7-day course of Cipro which he will complete this weekend.  He apparently had what sounds like a hydrocele drained in the 1980s.  He has a history of prostate surgery at [facility] around 2005.  He is on Plavix.   PMH: Past Medical History:  Diagnosis Date  . A-fib ([place])   . Cancer ([place])    skin / COLON  . Coronary artery disease   . Dysrhythmia   . Edema    FEET/LEGS  . Heart murmur   . Hypertension   . Myocardial infarction ([place])   . Stroke [facility])    X 2    Surgical History: Past Surgical History:  Procedure Laterality Date  . CARDIAC CATHETERIZATION N/A 08/06/2014   Procedure: Left Heart Cath and Coronary Angiography;   Surgeon: Charolette Forward, MD;  Location: [place] CV [facility];  Service: Cardiovascular;  Laterality: N/A;  . CATARACT EXTRACTION W/PHACO Left 04/21/2018   Procedure: CATARACT EXTRACTION PHACO AND INTRAOCULAR LENS PLACEMENT ([place]) LEFT;  Surgeon: Birder Robson, MD;  Location: [facility] ORS;  Service: Ophthalmology;  Laterality: Left;  [place] 01:50.9 CDE 17.73 Fluid Pack Lot # [id number]  . COLON SURGERY    . CORONARY ANGIOPLASTY      Home Medications:  Allergies as of 04/16/2019      Reactions   Other Rash, Shortness Of Breath   RED MEAT (BEEF and PORK)    Apixaban Itching, Rash   Warfarin Itching, Rash      Medication List       Accurate as of April 16, 2019 10:06 AM. If you have any questions, ask your nurse or doctor.        STOP taking these medications   pantoprazole 40 MG tablet Commonly known as: PROTONIX Stopped by: Abbie Sons, MD   predniSONE 20 MG tablet Commonly known as: Deltasone Stopped by: Abbie Sons, MD   ranitidine 150 MG capsule Commonly known as: ZANTAC Stopped by: Abbie Sons, MD     TAKE these medications   aspirin EC 81 MG tablet Take 81 mg by mouth daily.   atorvastatin 40 MG tablet Commonly known as: LIPITOR Take 1 tablet (40 mg total) by mouth daily at 6  PM.   augmented betamethasone dipropionate 0.05 % ointment Commonly known as: DIPROLENE-AF Apply 1 application topically 2 (two) times daily.   clopidogrel 75 MG tablet Commonly known as: PLAVIX Take 1 tablet (75 mg total) by mouth daily.   cyanocobalamin 1000 MCG tablet Take by mouth.   diphenhydrAMINE 25 mg capsule Commonly known as: BENADRYL Take 1 capsule (25 mg total) by mouth every 6 (six) hours as needed.   EPINEPHrine 0.3 mg/0.3 mL Soaj injection Commonly known as: EPI-PEN Inject 0.3 mLs (0.3 mg total) into the muscle once. Follow package instructions as needed for severe allergy or anaphylactic reaction.   furosemide 40 MG tablet Commonly known as: LASIX Take 80 mg  by mouth daily. with food   metolazone 5 MG tablet Commonly known as: ZAROXOLYN Take 5 mg by mouth daily.   metoprolol succinate 50 MG 24 hr tablet Commonly known as: TOPROL-XL Take 1 tablet (50 mg total) by mouth daily.   multivitamin tablet Take 1 tablet by mouth daily.   nitroGLYCERIN 0.4 MG SL tablet Commonly known as: NITROSTAT Place 1 tablet (0.4 mg total) under the tongue every 5 (five) minutes x 3 doses as needed for chest pain.   oxyCODONE 5 MG immediate release tablet Commonly known as: Roxicodone Take 1 tablet (5 mg total) by mouth every 8 (eight) hours as needed.   potassium chloride SA 20 MEQ tablet Commonly known as: KLOR-CON Take 20 mEq by mouth daily.   triamcinolone ointment 0.1 % Commonly known as: KENALOG Apply 1 application topically 2 (two) times daily as needed (for itching).       Allergies:  Allergies  Allergen Reactions  . Other Rash and Shortness Of Breath    RED MEAT (BEEF and PORK)   . Apixaban Itching and Rash  . Warfarin Itching and Rash    Family History: No family history on file.  Social History:  reports that he has never smoked. He has never used smokeless tobacco. He reports that he does not drink alcohol or use drugs.  ROS: UROLOGY Frequent Urination?: Yes Hard to postpone urination?: No Burning/pain with urination?: Yes Get up at night to urinate?: Yes Leakage of urine?: No Urine stream starts and stops?: Yes Trouble starting stream?: No Do you have to strain to urinate?: No Blood in urine?: Yes Urinary tract infection?: Yes Sexually transmitted disease?: No Injury to kidneys or bladder?: No Painful intercourse?: No Weak stream?: No Erection problems?: Yes Penile pain?: No  Gastrointestinal Nausea?: No Vomiting?: No Indigestion/heartburn?: No Diarrhea?: No Constipation?: No  Constitutional Fever: Yes Night sweats?: No Weight loss?: No Fatigue?: Yes  Skin Skin rash/lesions?: Yes Itching?: Yes  Eyes  Blurred vision?: No Double vision?: No  Ears/Nose/Throat Sore throat?: No Sinus problems?: No  Hematologic/Lymphatic Swollen glands?: No Easy bruising?: Yes  Cardiovascular Leg swelling?: Yes Chest pain?: No  Respiratory Cough?: No Shortness of breath?: Yes  Endocrine Excessive thirst?: No  Musculoskeletal Back pain?: No Joint pain?: No  Neurological Headaches?: No Dizziness?: No  Psychologic Depression?: No Anxiety?: No  Physical Exam: BP 107/70 (BP Location: Left Arm, Patient Position: Sitting, Cuff Size: Normal)   Pulse (!) 103   Ht 5\' 8"  (1.727 m)   Wt 168 lb (76.2 kg)   BMI 25.54 kg/m   Constitutional:  Alert and oriented, No acute distress. HEENT: [place] AT, moist mucus membranes.  Trachea midline, no masses. Cardiovascular: Marked lower extremity edema bilaterally Respiratory: Normal respiratory effort, no increased work of breathing. GI: Abdomen is soft, nontender,  nondistended, no abdominal masses GU: Phallus edematous, uncircumcised.  Moderate scrotal edema.  Testes descended bilaterally.  There may be a small right hydrocele however the majority of his swelling is secondary to scrotal wall edema. Skin: No rashes, bruises or suspicious lesions. Neurologic: Grossly intact, no focal deficits, moving all 4 extremities. Psychiatric: Normal mood and affect.   Assessment & Plan:    - Scrotal edema He has lower extremity and scrotal wall edema which is not drainable.  Will get a scrotal ultrasound to verify.  - Recurrent UTI Most likely secondary to prostatitis and incompletely treated.  Once he finishes his course of Cipro will treat with an additional 10 days of doxycycline.  Will schedule renal ultrasound.  Would also recommend cystoscopy.   Abbie Sons, [place] [address] [place], [place] [address] [phone]

## 2019-04-18 ENCOUNTER — Encounter: Payer: Self-pay | Admitting: Urology

## 2019-04-18 NOTE — Addendum Note (Signed)
Addended by: Abbie Sons on: 04/18/2019 10:55 AM   Modules accepted: Level of Service

## 2019-04-27 ENCOUNTER — Ambulatory Visit
Admission: RE | Admit: 2019-04-27 | Discharge: 2019-04-27 | Disposition: A | Discharge status: Home / Self Care | Payer: Medicare Other | Source: Ambulatory Visit | Attending: Urology | Admitting: Urology

## 2019-04-27 ENCOUNTER — Other Ambulatory Visit: Payer: Self-pay

## 2019-04-27 DIAGNOSIS — N5089 Other specified disorders of the male genital organs: Secondary | ICD-10-CM | POA: Diagnosis present

## 2019-04-27 DIAGNOSIS — R310 Gross hematuria: Secondary | ICD-10-CM | POA: Insufficient documentation

## 2019-04-28 ENCOUNTER — Telehealth: Payer: Self-pay | Admitting: *Deleted

## 2019-04-28 DIAGNOSIS — N390 Urinary tract infection, site not specified: Secondary | ICD-10-CM

## 2019-04-28 NOTE — Telephone Encounter (Signed)
-----   Message from Abbie Sons, MD sent at 04/28/2019  7:56 AM EST ----- Renal ultrasound showed no kidney abnormalities.  There is a possible small mass in the bladder.  Recommend scheduling cystoscopy.  He has had recurrent UTI and will need a lab visit/UA possible culture 1 week prior to cystoscopy

## 2019-04-28 NOTE — Telephone Encounter (Signed)
Scrotal ultrasound reviewed. It looks like the vast majority of his scrotal swelling is secondary to tissue fluid within the scrotal wall which is not drainable. The treatment is with diuretics.

## 2019-04-29 NOTE — Telephone Encounter (Signed)
Spoke with patient's daughter and she was notified. Cysto appointment was made and lab visit also

## 2019-05-10 ENCOUNTER — Other Ambulatory Visit: Payer: Self-pay

## 2019-05-10 ENCOUNTER — Other Ambulatory Visit: Payer: Medicare Other

## 2019-05-10 DIAGNOSIS — N390 Urinary tract infection, site not specified: Secondary | ICD-10-CM

## 2019-05-10 LAB — MICROSCOPIC EXAMINATION
Bacteria, UA: NONE SEEN
Epithelial Cells (non renal): NONE SEEN /hpf (ref 0–10)
RBC, Urine: NONE SEEN /hpf (ref 0–2)

## 2019-05-10 LAB — URINALYSIS, COMPLETE
Bilirubin, UA: NEGATIVE
Glucose, UA: NEGATIVE
Ketones, UA: NEGATIVE
Leukocytes,UA: NEGATIVE
Nitrite, UA: NEGATIVE
Protein,UA: NEGATIVE
RBC, UA: NEGATIVE
Specific Gravity, UA: 1.02 (ref 1.005–1.030)
Urobilinogen, Ur: 0.2 mg/dL (ref 0.2–1.0)
pH, UA: 6.5 (ref 5.0–7.5)

## 2019-05-12 ENCOUNTER — Telehealth: Payer: Self-pay | Admitting: *Deleted

## 2019-05-12 LAB — CULTURE, URINE COMPREHENSIVE

## 2019-05-12 NOTE — Telephone Encounter (Signed)
Left message for patient on vocal mail

## 2019-05-12 NOTE — Telephone Encounter (Signed)
-----   Message from Abbie Sons, MD sent at 05/11/2019  7:57 PM EST ----- Urinalysis was normal.  Keep 3/4 appointment

## 2019-05-14 ENCOUNTER — Telehealth: Payer: Self-pay | Admitting: *Deleted

## 2019-05-14 ENCOUNTER — Other Ambulatory Visit: Payer: Self-pay | Admitting: Urology

## 2019-05-14 MED ORDER — AMOXICILLIN 875 MG PO TABS: 875.0000 mg | ORAL_TABLET | Freq: Two times a day (BID) | ORAL | 0 refills | Status: DC

## 2019-05-14 NOTE — Telephone Encounter (Signed)
-----   Message from Abbie Sons, MD sent at 05/14/2019  3:30 PM EST ----- Urine culture is growing bacteria.  He is scheduled for cystoscopy next week.  I sent in an antibiotic to his pharmacy to get started this weekend.

## 2019-05-14 NOTE — Telephone Encounter (Signed)
Notified patient as instructed, patient pleased. Discussed follow-up appointments, patient agrees  

## 2019-05-15 ENCOUNTER — Ambulatory Visit: Payer: Medicare Other | Attending: Internal Medicine

## 2019-05-15 DIAGNOSIS — Z23 Encounter for immunization: Secondary | ICD-10-CM | POA: Insufficient documentation

## 2019-05-15 NOTE — Progress Notes (Signed)
   [id number] Vaccination Clinic  Name:  Dustin Acosta.    MRN: [id number] DOB: [birth year]  05/15/2019  [name] was observed post Covid-19 immunization for 15 minutes without incidence. He was provided with Vaccine Information Sheet and instruction to access the V-Safe system.   [name] was instructed to call 911 with any severe reactions post vaccine: Marland Kitchen Difficulty breathing  . Swelling of your face and throat  . A fast heartbeat  . A bad rash all over your body  . Dizziness and weakness    Immunizations Administered    Name Date Dose VIS Date Route   Moderna COVID-19 Vaccine 05/15/2019  2:00 PM 0.5 mL 02/16/2019 Intramuscular   Manufacturer: Moderna   Lot: [id number]   [id number]

## 2019-05-20 ENCOUNTER — Other Ambulatory Visit: Payer: Self-pay

## 2019-05-20 ENCOUNTER — Encounter: Payer: Self-pay | Admitting: Urology

## 2019-05-20 ENCOUNTER — Ambulatory Visit (INDEPENDENT_AMBULATORY_CARE_PROVIDER_SITE_OTHER): Payer: Medicare Other | Admitting: Urology

## 2019-05-20 VITALS — BP 130/80 | HR 74 | Ht 72.0 in | Wt 168.0 lb

## 2019-05-20 DIAGNOSIS — N390 Urinary tract infection, site not specified: Secondary | ICD-10-CM | POA: Diagnosis not present

## 2019-05-20 LAB — URINALYSIS, COMPLETE
Bilirubin, UA: NEGATIVE
Glucose, UA: NEGATIVE
Ketones, UA: NEGATIVE
Nitrite, UA: NEGATIVE
Protein,UA: NEGATIVE
RBC, UA: NEGATIVE
Specific Gravity, UA: 1.02 (ref 1.005–1.030)
Urobilinogen, Ur: 0.2 mg/dL (ref 0.2–1.0)
pH, UA: 6.5 (ref 5.0–7.5)

## 2019-05-20 LAB — MICROSCOPIC EXAMINATION
Bacteria, UA: NONE SEEN
RBC, Urine: NONE SEEN /hpf (ref 0–2)

## 2019-05-20 NOTE — Progress Notes (Signed)
   05/20/19  CC:  Chief Complaint  Patient presents with  . Cysto    HPI: 86 yo male seen 04/16/2019 for scrotal edema.  He had recurrent UTI.  Renal ultrasound showed no mass or hydronephrosis.  There was a questionable bladder mass present.  Scrotal ultrasound showed small bilateral hydroceles and marked scrotal wall edema.  He presents today for cystoscopy.  Urine culture last week grew low level of Enterococcus and he was started on amoxicillin.  Blood pressure 130/80, pulse 74, height 6' (1.829 m), weight 168 lb (76.2 kg). NED. A&Ox3.   No respiratory distress   Anasarca with marked lower extremity edema to the thighs, significant scrotal and penile edema and edema of the lower abdominal wall.  Some erythema and weeping of thigh regions.  Cystoscopy Procedure Note  Patient identification was confirmed, informed consent was obtained, and patient was prepped using Betadine solution.  Lidocaine could not be administered secondary to penile edema   Pre-Procedure: - Inspection reveals marked penile edema with inability to visualize glans  Procedure: The flexible cystoscope was introduced through the edematous prepuce.  The glans was visualized.  The meatus was located endoscopically and the scope was advanced per urethra. - No urethral strictures/lesions are present. - TUR changes with open prostatic fossa.  - Normal bladder neck - Bilateral ureteral orifices identified - Bladder mucosa  reveals no ulcers, tumors, or lesions - No bladder stones -Marked bladder trabeculation with cellules and small diverticula  Retroflexion shows no abnormalities   Post-Procedure: - Patient tolerated the procedure well  Assessment/ Plan: - No bladder mucosal abnormalities on cystoscopy - Anasarca with scrotal/penile edema -Follow-up with Dr. Edwina Barth regarding anasarca treatment   Abbie Sons, MD

## 2019-05-21 ENCOUNTER — Encounter: Payer: Self-pay | Admitting: Urology

## 2019-06-02 ENCOUNTER — Other Ambulatory Visit: Payer: Self-pay

## 2019-06-02 ENCOUNTER — Emergency Department
Admission: EM | Admit: 2019-06-02 | Discharge: 2019-06-02 | Disposition: A | Discharge status: Home / Self Care | Payer: Medicare Other | Attending: Emergency Medicine | Admitting: Emergency Medicine

## 2019-06-02 ENCOUNTER — Emergency Department: Payer: Medicare Other

## 2019-06-02 DIAGNOSIS — I252 Old myocardial infarction: Secondary | ICD-10-CM | POA: Insufficient documentation

## 2019-06-02 DIAGNOSIS — R079 Chest pain, unspecified: Secondary | ICD-10-CM | POA: Diagnosis present

## 2019-06-02 DIAGNOSIS — I10 Essential (primary) hypertension: Secondary | ICD-10-CM | POA: Insufficient documentation

## 2019-06-02 DIAGNOSIS — Z85828 Personal history of other malignant neoplasm of skin: Secondary | ICD-10-CM | POA: Diagnosis not present

## 2019-06-02 DIAGNOSIS — Z79899 Other long term (current) drug therapy: Secondary | ICD-10-CM | POA: Diagnosis not present

## 2019-06-02 DIAGNOSIS — I25119 Atherosclerotic heart disease of native coronary artery with unspecified angina pectoris: Secondary | ICD-10-CM | POA: Diagnosis not present

## 2019-06-02 DIAGNOSIS — Z8673 Personal history of transient ischemic attack (TIA), and cerebral infarction without residual deficits: Secondary | ICD-10-CM | POA: Diagnosis not present

## 2019-06-02 DIAGNOSIS — R0602 Shortness of breath: Secondary | ICD-10-CM | POA: Insufficient documentation

## 2019-06-02 DIAGNOSIS — Z955 Presence of coronary angioplasty implant and graft: Secondary | ICD-10-CM | POA: Diagnosis not present

## 2019-06-02 DIAGNOSIS — Z7982 Long term (current) use of aspirin: Secondary | ICD-10-CM | POA: Diagnosis not present

## 2019-06-02 DIAGNOSIS — Z85038 Personal history of other malignant neoplasm of large intestine: Secondary | ICD-10-CM | POA: Diagnosis not present

## 2019-06-02 LAB — BASIC METABOLIC PANEL
Anion gap: 12 (ref 5–15)
BUN: 15 mg/dL (ref 8–23)
CO2: 29 mmol/L (ref 22–32)
Calcium: 8.6 mg/dL — ABNORMAL LOW (ref 8.9–10.3)
Chloride: 100 mmol/L (ref 98–111)
Creatinine, Ser: 0.77 mg/dL (ref 0.61–1.24)
GFR calc Af Amer: >60 mL/min (ref >60)
GFR calc non Af Amer: >60 mL/min (ref >60)
Glucose, Bld: 98 mg/dL (ref 70–99)
Potassium: 3.8 mmol/L (ref 3.5–5.1)
Sodium: 141 mmol/L (ref 135–145)

## 2019-06-02 LAB — BRAIN NATRIURETIC PEPTIDE: B Natriuretic Peptide: 462 pg/mL — ABNORMAL HIGH (ref 0.0–100.0)

## 2019-06-02 LAB — CBC
HCT: 38.7 % — ABNORMAL LOW (ref 39.0–52.0)
Hemoglobin: 12.9 g/dL — ABNORMAL LOW (ref 13.0–17.0)
MCH: 33 pg (ref 26.0–34.0)
MCHC: 33.3 g/dL (ref 30.0–36.0)
MCV: 99 fL (ref 80.0–100.0)
Platelets: 121 10*3/uL — ABNORMAL LOW (ref 150–400)
RBC: 3.91 MIL/uL — ABNORMAL LOW (ref 4.22–5.81)
RDW: 15.7 % — ABNORMAL HIGH (ref 11.5–15.5)
WBC: 5.7 10*3/uL (ref 4.0–10.5)
nRBC: 0 % (ref 0.0–0.2)

## 2019-06-02 LAB — TROPONIN I (HIGH SENSITIVITY)
Troponin I (High Sensitivity): 8 ng/L (ref <18)
Troponin I (High Sensitivity): 7 ng/L (ref <18)

## 2019-06-02 MED ORDER — SODIUM CHLORIDE 0.9% FLUSH: 3.0000 mL | Freq: Once | INTRAVENOUS | Status: DC

## 2019-06-02 NOTE — ED Provider Notes (Signed)
[facility] Emergency Department Provider Note ____________________________________________   First MD Initiated Contact with Patient 06/02/19 1259     (approximate)  I have reviewed the triage vital signs and the nursing notes.   HISTORY  Chief Complaint Chest Pain    HPI Dustin Farar. is a 86 y.o. male with PMH as noted below including CAD status post MI most recently in 2016 who presents with chest pain, described as sharp, mainly in the left chest, and occurring around 8 AM today when he was getting up.  The patient states that he took 2 nitroglycerin and the pain resolved, but he has had it intermittently since then especially when he got up to go to the bathroom while in the ED.  He reports chronic shortness of breath that has not worsened recently.  He was also more fatigued than usual over the last few days.  He states that the pain today feels like when he had an MI in 2016.  Past Medical History:  Diagnosis Date  . A-fib ([place])   . Cancer ([place])    skin / COLON  . Coronary artery disease   . Dysrhythmia   . Edema    FEET/LEGS  . Heart murmur   . Hypertension   . Myocardial infarction ([place])   . Stroke [facility])    X 2    Patient Active Problem List   Diagnosis Date Noted  . Cancer of sigmoid colon ([place]) 04/16/2019  . Gout 04/16/2019  . Stones, prostate 04/16/2019  . HFrEF (heart failure with reduced ejection fraction) ([place]) 03/08/2019  . Thrombocytopenia ([place]) 12/09/2018  . Peripheral vascular disease ([place]) 12/08/2018  . H/O macrocytic anemia 09/03/2017  . Bilateral edema of lower extremity 12/19/2014  . Coronary artery disease of native artery of native heart with stable angina pectoris ([place]) 10/03/2014  . Acute MI, inferoposterior wall ([place]) 08/06/2014  . A-fib ([place]) 12/10/2013  . Hyperlipidemia 12/10/2013  . Cerebral infarction ([place]) 07/16/2012    Past Surgical History:  Procedure Laterality Date  . CARDIAC CATHETERIZATION  N/A 08/06/2014   Procedure: Left Heart Cath and Coronary Angiography;  Surgeon: Charolette Forward, MD;  Location: [place] [facility] LAB;  Service: Cardiovascular;  Laterality: N/A;  . CATARACT EXTRACTION W/PHACO Left 04/21/2018   Procedure: CATARACT EXTRACTION PHACO AND INTRAOCULAR LENS PLACEMENT ([place]) LEFT;  Surgeon: Birder Robson, MD;  Location: [facility] ORS;  Service: Ophthalmology;  Laterality: Left;  [place] 01:50.9 CDE 17.73 Fluid Pack Lot # [id number]  . COLON SURGERY    . CORONARY ANGIOPLASTY      Prior to Admission medications   Medication Sig Start Date End Date Taking? Authorizing Provider  amoxicillin (AMOXIL) 875 MG tablet Take 1 tablet (875 mg total) by mouth every 12 (twelve) hours. 05/14/19   Stoioff, Ronda Fairly, MD  aspirin EC 81 MG tablet Take 81 mg by mouth daily.    [provider]  atorvastatin (LIPITOR) 40 MG tablet Take 1 tablet (40 mg total) by mouth daily at 6 PM. 08/09/14   Charolette Forward, MD  augmented betamethasone dipropionate (DIPROLENE-AF) 0.05 % ointment Apply 1 application topically 2 (two) times daily. 07/03/18   [provider]  clopidogrel (PLAVIX) 75 MG tablet Take 1 tablet (75 mg total) by mouth daily. 08/09/14   Charolette Forward, MD  cyanocobalamin 1000 MCG tablet Take by mouth. 09/03/17   [provider]  diphenhydrAMINE (BENADRYL) 25 mg capsule Take 1 capsule (25 mg total) by mouth every 6 (six) hours  as needed. 03/28/15   Carrie Mew, MD  EPINEPHrine 0.3 mg/0.3 mL IJ SOAJ injection Inject 0.3 mLs (0.3 mg total) into the muscle once. Follow package instructions as needed for severe allergy or anaphylactic reaction. 03/28/15   Carrie Mew, MD  furosemide (LASIX) 40 MG tablet Take 80 mg by mouth daily. with food 03/09/19   [provider]  metolazone (ZAROXOLYN) 5 MG tablet Take 5 mg by mouth daily. 05/18/18 05/18/19  [provider]  metoprolol succinate (TOPROL-XL) 50 MG 24 hr tablet Take 1 tablet (50 mg total) by mouth daily.  08/09/14   Charolette Forward, MD  Multiple Vitamin (MULTIVITAMIN) tablet Take 1 tablet by mouth daily.    [provider]  nitroGLYCERIN (NITROSTAT) 0.4 MG SL tablet Place 1 tablet (0.4 mg total) under the tongue every 5 (five) minutes x 3 doses as needed for chest pain. 08/09/14   Charolette Forward, MD  oxyCODONE (ROXICODONE) 5 MG immediate release tablet Take 1 tablet (5 mg total) by mouth every 8 (eight) hours as needed. 07/16/18 07/16/19  Rudene Re, MD  potassium chloride SA (K-DUR) 20 MEQ tablet Take 20 mEq by mouth daily. 05/26/18 05/26/19  [provider]  triamcinolone ointment (KENALOG) 0.1 % Apply 1 application topically 2 (two) times daily as needed (for itching).  06/23/14   [provider]    Allergies Other, Apixaban, and Warfarin  No family history on file.  Social History Social History   Tobacco Use  . Smoking status: Never Smoker  . Smokeless tobacco: Never Used  Substance Use Topics  . Alcohol use: No  . Drug use: No    Review of Systems  Constitutional: No fever. Eyes: No redness. ENT: No sore throat. Cardiovascular: Positive for chest pain. Respiratory: Positive for shortness of breath. Gastrointestinal: No vomiting or diarrhea.  Genitourinary: Negative for flank pain.  Musculoskeletal: Negative for back pain. Skin: Negative for rash. Neurological: Negative for headache.   ____________________________________________   PHYSICAL EXAM:  VITAL SIGNS: ED Triage Vitals  Enc Vitals Group     BP 06/02/19 1020 130/71     Pulse Rate 06/02/19 1020 91     Resp 06/02/19 1020 16     Temp 06/02/19 1020 98.2 F (36.8 C)     Temp Source 06/02/19 1020 Oral     SpO2 06/02/19 1020 95 %     Weight 06/02/19 1021 162 lb (73.5 kg)     Height 06/02/19 1021 5\' 8"  (1.727 m)     Head Circumference --      Peak Flow --      Pain Score 06/02/19 1021 0     Pain Loc --      Pain Edu? --      Excl. in [place]? --     Constitutional: Alert and  oriented. Well appearing for age and in no acute distress. Eyes: Conjunctivae are normal.  Head: Atraumatic. Nose: No congestion/rhinnorhea. Mouth/Throat: Mucous membranes are moist.   Neck: Normal range of motion.  Cardiovascular: Normal rate, regular rhythm. Grossly normal heart sounds.  Good peripheral circulation. Respiratory: Normal respiratory effort.  No retractions. Lungs CTAB. Gastrointestinal: No distention.  Musculoskeletal: 1+ bilateral lower extremity edema.  Extremities warm and well perfused.  Neurologic:  Normal speech and language. No gross focal neurologic deficits are appreciated.  Skin:  Skin is warm and dry. No rash noted. Psychiatric: Mood and affect are normal. Speech and behavior are normal.  ____________________________________________   LABS (all labs ordered are listed, but  only abnormal results are displayed)  Labs Reviewed  BASIC METABOLIC PANEL - Abnormal; Notable for the following components:      Result Value   Calcium 8.6 (*)    All other components within normal limits  CBC - Abnormal; Notable for the following components:   RBC 3.91 (*)    Hemoglobin 12.9 (*)    HCT 38.7 (*)    RDW 15.7 (*)    Platelets 121 (*)    All other components within normal limits  BRAIN NATRIURETIC PEPTIDE - Abnormal; Notable for the following components:   B Natriuretic Peptide 462.0 (*)    All other components within normal limits  TROPONIN I (HIGH SENSITIVITY)  TROPONIN I (HIGH SENSITIVITY)   ____________________________________________  EKG  ED ECG REPORT I, Arta Silence, the attending physician, personally viewed and interpreted this ECG.  Date: 06/02/2019 EKG Time: 1012 Rate: 95 Rhythm: Atrial fibrillation QRS Axis: Right axis Intervals: normal ST/T Wave abnormalities: Nonspecific ST abnormalities Narrative Interpretation: Nonspecific abnormalities with no evidence of acute  ischemia  ____________________________________________  RADIOLOGY  CXR: Cardiomegaly, bilateral effusions, no significant edema  ____________________________________________   PROCEDURES  Procedure(s) performed: No  Procedures  Critical Care performed: No ____________________________________________   INITIAL IMPRESSION / ASSESSMENT AND PLAN / ED COURSE  Pertinent labs & imaging results that were available during my care of the patient were reviewed by me and considered in my medical decision making (see chart for details).  86 year old male with PMH as noted above including history of atrial fibrillation and CAD with MI in 2016 presents with acute onset of chest pain this morning which was initially relieved by nitroglycerin and has been occurring intermittently since then.  She states that it feels similar to the pain he experienced during his MI.  He reports chronic shortness of breath with no acute change.  I reviewed the past medical records in [place].  The patient has a history of PCI to the RCA in 2016.  He had an stress test in January showing no evidence of reversible ischemia.  On exam today, the patient is overall very well-appearing for his age.  His vital signs are normal.  The physical exam is unremarkable except for bilateral lower extremity edema.  EKG shows no acute ischemic findings.  Presentation is somewhat concerning for unstable angina/ACS.  The patient's initial negative troponin and resolved pain are reassuring, however he does have elevated risk of ACS and the fact that the pain feels similar to a prior MI is concerning.  Work-up does not reveal evidence of an acute CHF exacerbation.  Given the intermittent nature of the pain and the normal vital signs, I do not suspect pulmonary embolism, aortic dissection, or other vascular etiology.  We will obtain a repeat troponin and BNP.  I will discuss with the patient's cardiologist Dr.  Ubaldo Glassing.  ----------------------------------------- 3:48 PM on 06/02/2019 -----------------------------------------  Repeat troponin is negative.  The patient has had no further pain in the ED.  I discussed the case with Dr. Ubaldo Glassing over the phone, who advised that if the patient is no longer having pain and the troponins are negative he would feel comfortable with the patient following up tomorrow in the office.  On reassessment, the patient feels well.  The patient feels comfortable going home and agrees to follow-up with Dr. Baron Sane tomorrow.  I counseled him on the results of the work-up and gave him thorough return precautions.  He expressed understanding. ____________________________________________   FINAL CLINICAL IMPRESSION(S) / ED  DIAGNOSES  Final diagnoses:  Chest pain, unspecified type      NEW MEDICATIONS STARTED DURING THIS VISIT:  New Prescriptions   No medications on file     Note:  This document was prepared using Dragon voice recognition software and may include unintentional dictation errors.    Arta Silence, MD 06/02/19 1549

## 2019-06-02 NOTE — ED Triage Notes (Signed)
Pt c/o left sided chest pain that started around 8am today with SOB, states he took 2 nitro pta, states the pain feels the same as when he had a MI in the past. Pt is in NAD at present.

## 2019-06-02 NOTE — Discharge Instructions (Addendum)
Follow-up with Dr. Ubaldo Glassing tomorrow at 8:45 AM.  He will be expecting you.  Return to the ER immediately for new, worsening, or persistent severe chest pain, pain that does not respond to nitroglycerin, shortness of breath, weakness, or any other new or worsening symptoms that concern you.

## 2019-06-03 ENCOUNTER — Ambulatory Visit
Admission: RE | Admit: 2019-06-03 | Discharge: 2019-06-03 | Disposition: A | Discharge status: Home / Self Care | Payer: Medicare Other | Source: Ambulatory Visit | Attending: Cardiology | Admitting: Cardiology

## 2019-06-03 ENCOUNTER — Other Ambulatory Visit: Payer: Self-pay | Admitting: Cardiology

## 2019-06-03 DIAGNOSIS — M79621 Pain in right upper arm: Secondary | ICD-10-CM

## 2019-06-12 ENCOUNTER — Ambulatory Visit: Payer: Medicare Other | Attending: Internal Medicine

## 2019-06-12 DIAGNOSIS — Z23 Encounter for immunization: Secondary | ICD-10-CM

## 2019-06-12 NOTE — Progress Notes (Signed)
   [id number] Vaccination Clinic  Name:  Dustin Acosta.    MRN: [id number] DOB: Apr 18, 1924  06/12/2019  [name] was observed post Covid-19 immunization for 15 minutes without incident. He was provided with Vaccine Information Sheet and instruction to access the V-Safe system.   [name] was instructed to call 911 with any severe reactions post vaccine: Marland Kitchen Difficulty breathing  . Swelling of face and throat  . A fast heartbeat  . A bad rash all over body  . Dizziness and weakness   Immunizations Administered    Name Date Dose VIS Date Route   Moderna COVID-19 Vaccine 06/12/2019 10:24 AM 0.5 mL 02/16/2019 Intramuscular   Manufacturer: Moderna   Lot: [id number]   [id number]

## 2020-09-01 ENCOUNTER — Inpatient Hospital Stay
Admission: EM | Admit: 2020-09-01 | Discharge: 2020-09-15 | DRG: 871 | Disposition: E | Payer: Medicare Other | Attending: Internal Medicine | Admitting: Internal Medicine

## 2020-09-01 ENCOUNTER — Emergency Department: Payer: Medicare Other

## 2020-09-01 ENCOUNTER — Encounter: Payer: Self-pay | Admitting: Emergency Medicine

## 2020-09-01 ENCOUNTER — Other Ambulatory Visit: Payer: Self-pay

## 2020-09-01 DIAGNOSIS — I4891 Unspecified atrial fibrillation: Secondary | ICD-10-CM | POA: Diagnosis present

## 2020-09-01 DIAGNOSIS — K567 Ileus, unspecified: Secondary | ICD-10-CM

## 2020-09-01 DIAGNOSIS — R944 Abnormal results of kidney function studies: Secondary | ICD-10-CM | POA: Diagnosis present

## 2020-09-01 DIAGNOSIS — Z20822 Contact with and (suspected) exposure to covid-19: Secondary | ICD-10-CM | POA: Diagnosis present

## 2020-09-01 DIAGNOSIS — R54 Age-related physical debility: Secondary | ICD-10-CM | POA: Diagnosis present

## 2020-09-01 DIAGNOSIS — J189 Pneumonia, unspecified organism: Secondary | ICD-10-CM | POA: Diagnosis present

## 2020-09-01 DIAGNOSIS — I252 Old myocardial infarction: Secondary | ICD-10-CM

## 2020-09-01 DIAGNOSIS — I272 Pulmonary hypertension, unspecified: Secondary | ICD-10-CM | POA: Diagnosis present

## 2020-09-01 DIAGNOSIS — E782 Mixed hyperlipidemia: Secondary | ICD-10-CM

## 2020-09-01 DIAGNOSIS — I4821 Permanent atrial fibrillation: Secondary | ICD-10-CM

## 2020-09-01 DIAGNOSIS — R112 Nausea with vomiting, unspecified: Secondary | ICD-10-CM | POA: Diagnosis present

## 2020-09-01 DIAGNOSIS — A419 Sepsis, unspecified organism: Secondary | ICD-10-CM | POA: Diagnosis not present

## 2020-09-01 DIAGNOSIS — I5020 Unspecified systolic (congestive) heart failure: Secondary | ICD-10-CM

## 2020-09-01 DIAGNOSIS — Z515 Encounter for palliative care: Secondary | ICD-10-CM

## 2020-09-01 DIAGNOSIS — R079 Chest pain, unspecified: Secondary | ICD-10-CM | POA: Diagnosis not present

## 2020-09-01 DIAGNOSIS — E785 Hyperlipidemia, unspecified: Secondary | ICD-10-CM | POA: Diagnosis present

## 2020-09-01 DIAGNOSIS — I10 Essential (primary) hypertension: Secondary | ICD-10-CM | POA: Diagnosis present

## 2020-09-01 DIAGNOSIS — Z955 Presence of coronary angioplasty implant and graft: Secondary | ICD-10-CM

## 2020-09-01 DIAGNOSIS — E860 Dehydration: Secondary | ICD-10-CM | POA: Diagnosis present

## 2020-09-01 DIAGNOSIS — R600 Localized edema: Secondary | ICD-10-CM | POA: Diagnosis present

## 2020-09-01 DIAGNOSIS — R778 Other specified abnormalities of plasma proteins: Secondary | ICD-10-CM | POA: Diagnosis present

## 2020-09-01 DIAGNOSIS — I739 Peripheral vascular disease, unspecified: Secondary | ICD-10-CM | POA: Diagnosis present

## 2020-09-01 DIAGNOSIS — J9601 Acute respiratory failure with hypoxia: Secondary | ICD-10-CM | POA: Diagnosis present

## 2020-09-01 DIAGNOSIS — Z79899 Other long term (current) drug therapy: Secondary | ICD-10-CM

## 2020-09-01 DIAGNOSIS — K529 Noninfective gastroenteritis and colitis, unspecified: Secondary | ICD-10-CM | POA: Diagnosis not present

## 2020-09-01 DIAGNOSIS — I2510 Atherosclerotic heart disease of native coronary artery without angina pectoris: Secondary | ICD-10-CM | POA: Diagnosis present

## 2020-09-01 DIAGNOSIS — Z66 Do not resuscitate: Secondary | ICD-10-CM | POA: Diagnosis not present

## 2020-09-01 DIAGNOSIS — I482 Chronic atrial fibrillation, unspecified: Secondary | ICD-10-CM | POA: Diagnosis present

## 2020-09-01 DIAGNOSIS — Z7982 Long term (current) use of aspirin: Secondary | ICD-10-CM

## 2020-09-01 DIAGNOSIS — G4700 Insomnia, unspecified: Secondary | ICD-10-CM | POA: Diagnosis present

## 2020-09-01 DIAGNOSIS — Z6824 Body mass index (BMI) 24.0-24.9, adult: Secondary | ICD-10-CM

## 2020-09-01 DIAGNOSIS — R64 Cachexia: Secondary | ICD-10-CM | POA: Diagnosis present

## 2020-09-01 DIAGNOSIS — Z5320 Procedure and treatment not carried out because of patient's decision for unspecified reasons: Secondary | ICD-10-CM | POA: Diagnosis not present

## 2020-09-01 DIAGNOSIS — E876 Hypokalemia: Secondary | ICD-10-CM | POA: Diagnosis present

## 2020-09-01 DIAGNOSIS — Z888 Allergy status to other drugs, medicaments and biological substances status: Secondary | ICD-10-CM

## 2020-09-01 DIAGNOSIS — E871 Hypo-osmolality and hyponatremia: Secondary | ICD-10-CM | POA: Diagnosis present

## 2020-09-01 DIAGNOSIS — Z8673 Personal history of transient ischemic attack (TIA), and cerebral infarction without residual deficits: Secondary | ICD-10-CM

## 2020-09-01 LAB — BASIC METABOLIC PANEL
Anion gap: 15 (ref 5–15)
Anion gap: 13 (ref 5–15)
BUN: 27 mg/dL — ABNORMAL HIGH (ref 8–23)
BUN: 25 mg/dL — ABNORMAL HIGH (ref 8–23)
CO2: 35 mmol/L — ABNORMAL HIGH (ref 22–32)
CO2: 35 mmol/L — ABNORMAL HIGH (ref 22–32)
Calcium: 10.6 mg/dL — ABNORMAL HIGH (ref 8.9–10.3)
Calcium: 10.6 mg/dL — ABNORMAL HIGH (ref 8.9–10.3)
Chloride: 83 mmol/L — ABNORMAL LOW (ref 98–111)
Chloride: 83 mmol/L — ABNORMAL LOW (ref 98–111)
Creatinine, Ser: 0.97 mg/dL (ref 0.61–1.24)
Creatinine, Ser: 0.92 mg/dL (ref 0.61–1.24)
GFR, Estimated: >60 mL/min (ref >60)
GFR, Estimated: >60 mL/min (ref >60)
Glucose, Bld: 111 mg/dL — ABNORMAL HIGH (ref 70–99)
Glucose, Bld: 116 mg/dL — ABNORMAL HIGH (ref 70–99)
Potassium: 2.2 mmol/L — CL (ref 3.5–5.1)
Potassium: 2.5 mmol/L — CL (ref 3.5–5.1)
Sodium: 133 mmol/L — ABNORMAL LOW (ref 135–145)
Sodium: 131 mmol/L — ABNORMAL LOW (ref 135–145)

## 2020-09-01 LAB — RESP PANEL BY RT-PCR (FLU A&B, COVID) ARPGX2
Influenza A by PCR: NEGATIVE
Influenza B by PCR: NEGATIVE
SARS Coronavirus 2 by RT PCR: NEGATIVE

## 2020-09-01 LAB — CBC
HCT: 39.8 % (ref 39.0–52.0)
Hemoglobin: 14.2 g/dL (ref 13.0–17.0)
MCH: 33.1 pg (ref 26.0–34.0)
MCHC: 35.7 g/dL (ref 30.0–36.0)
MCV: 92.8 fL (ref 80.0–100.0)
Platelets: 111 10*3/uL — ABNORMAL LOW (ref 150–400)
RBC: 4.29 MIL/uL (ref 4.22–5.81)
RDW: 14.3 % (ref 11.5–15.5)
WBC: 9.7 10*3/uL (ref 4.0–10.5)
nRBC: 0 % (ref 0.0–0.2)

## 2020-09-01 LAB — HEPATIC FUNCTION PANEL
ALT: 22 U/L (ref 0–44)
AST: 42 U/L — ABNORMAL HIGH (ref 15–41)
Albumin: 3.6 g/dL (ref 3.5–5.0)
Alkaline Phosphatase: 94 U/L (ref 38–126)
Bilirubin, Direct: 0.8 mg/dL — ABNORMAL HIGH (ref 0.0–0.2)
Indirect Bilirubin: 1.9 mg/dL — ABNORMAL HIGH (ref 0.3–0.9)
Total Bilirubin: 2.7 mg/dL — ABNORMAL HIGH (ref 0.3–1.2)
Total Protein: 7.1 g/dL (ref 6.5–8.1)

## 2020-09-01 LAB — MAGNESIUM: Magnesium: 1.5 mg/dL — ABNORMAL LOW (ref 1.7–2.4)

## 2020-09-01 LAB — TROPONIN I (HIGH SENSITIVITY)
Troponin I (High Sensitivity): 17 ng/L (ref <18)
Troponin I (High Sensitivity): 18 ng/L — ABNORMAL HIGH (ref <18)
Troponin I (High Sensitivity): 18 ng/L — ABNORMAL HIGH (ref <18)

## 2020-09-01 LAB — LIPASE, BLOOD: Lipase: 39 U/L (ref 11–51)

## 2020-09-01 LAB — D-DIMER, QUANTITATIVE: D-Dimer, Quant: 3.22 ug/mL-FEU — ABNORMAL HIGH (ref 0.00–0.50)

## 2020-09-01 LAB — LACTIC ACID, PLASMA
Lactic Acid, Venous: 2.8 mmol/L (ref 0.5–1.9)
Lactic Acid, Venous: 2.5 mmol/L (ref 0.5–1.9)

## 2020-09-01 MED ORDER — ONDANSETRON HCL 4 MG/2ML IJ SOLN
4.0000 mg | Freq: Once | INTRAMUSCULAR | Status: AC
  Administered 2020-09-01: 4 mg via INTRAVENOUS
  Filled 2020-09-01: qty 2

## 2020-09-01 MED ORDER — MELATONIN 5 MG PO TABS
5.0000 mg | ORAL_TABLET | Freq: Every evening | ORAL | Status: DC | PRN
  Administered 2020-09-01: 5 mg via ORAL
  Filled 2020-09-01 (×2): qty 1

## 2020-09-01 MED ORDER — ADULT MULTIVITAMIN W/MINERALS CH: 1.0000 | ORAL_TABLET | Freq: Every day | ORAL | Status: DC

## 2020-09-01 MED ORDER — ENOXAPARIN SODIUM 40 MG/0.4ML IJ SOSY
40.0000 mg | PREFILLED_SYRINGE | INTRAMUSCULAR | Status: DC
  Administered 2020-09-01 – 2020-09-02 (×2): 40 mg via SUBCUTANEOUS
  Filled 2020-09-01 (×2): qty 0.4

## 2020-09-01 MED ORDER — VITAMIN B-12 1000 MCG PO TABS: 1000.0000 ug | ORAL_TABLET | Freq: Every day | ORAL | Status: DC

## 2020-09-01 MED ORDER — SODIUM CHLORIDE 0.9 % IV SOLN
1.0000 g | INTRAVENOUS | Status: DC
  Administered 2020-09-02 – 2020-09-03 (×2): 1 g via INTRAVENOUS
  Filled 2020-09-01: qty 1
  Filled 2020-09-01: qty 10
  Filled 2020-09-01: qty 1

## 2020-09-01 MED ORDER — PNEUMOCOCCAL VAC POLYVALENT 25 MCG/0.5ML IJ INJ: 0.5000 mL | INJECTION | INTRAMUSCULAR | Status: DC

## 2020-09-01 MED ORDER — ASPIRIN EC 81 MG PO TBEC: 81.0000 mg | DELAYED_RELEASE_TABLET | Freq: Every day | ORAL | Status: DC

## 2020-09-01 MED ORDER — LACTATED RINGERS IV SOLN: INTRAVENOUS | Status: DC

## 2020-09-01 MED ORDER — ACETAMINOPHEN 650 MG RE SUPP: 650.0000 mg | Freq: Four times a day (QID) | RECTAL | Status: AC | PRN

## 2020-09-01 MED ORDER — NITROGLYCERIN 0.4 MG SL SUBL: 0.4000 mg | SUBLINGUAL_TABLET | SUBLINGUAL | Status: DC | PRN

## 2020-09-01 MED ORDER — SODIUM CHLORIDE 0.9 % IV SOLN
500.0000 mg | Freq: Once | INTRAVENOUS | Status: AC
  Administered 2020-09-01: 500 mg via INTRAVENOUS
  Filled 2020-09-01: qty 500

## 2020-09-01 MED ORDER — POTASSIUM CHLORIDE 10 MEQ/100ML IV SOLN
10.0000 meq | INTRAVENOUS | Status: AC
  Administered 2020-09-02 (×5): 10 meq via INTRAVENOUS
  Filled 2020-09-01 (×3): qty 100

## 2020-09-01 MED ORDER — POTASSIUM CHLORIDE 10 MEQ/100ML IV SOLN
10.0000 meq | INTRAVENOUS | Status: AC
  Administered 2020-09-01 (×4): 10 meq via INTRAVENOUS
  Filled 2020-09-01 (×4): qty 100

## 2020-09-01 MED ORDER — IOHEXOL 350 MG/ML SOLN
75.0000 mL | Freq: Once | INTRAVENOUS | Status: AC | PRN
  Administered 2020-09-01: 75 mL via INTRAVENOUS

## 2020-09-01 MED ORDER — FUROSEMIDE 40 MG PO TABS: 80.0000 mg | ORAL_TABLET | Freq: Every day | ORAL | Status: DC

## 2020-09-01 MED ORDER — ATORVASTATIN CALCIUM 20 MG PO TABS: 40.0000 mg | ORAL_TABLET | Freq: Every day | ORAL | Status: DC

## 2020-09-01 MED ORDER — POTASSIUM CHLORIDE CRYS ER 20 MEQ PO TBCR
80.0000 meq | EXTENDED_RELEASE_TABLET | Freq: Once | ORAL | Status: AC
  Administered 2020-09-01: 80 meq via ORAL
  Filled 2020-09-01: qty 4

## 2020-09-01 MED ORDER — ONDANSETRON HCL 4 MG PO TABS: 4.0000 mg | ORAL_TABLET | Freq: Four times a day (QID) | ORAL | Status: DC | PRN

## 2020-09-01 MED ORDER — LACTATED RINGERS IV BOLUS
500.0000 mL | Freq: Once | INTRAVENOUS | Status: AC
  Administered 2020-09-01: 500 mL via INTRAVENOUS

## 2020-09-01 MED ORDER — SODIUM CHLORIDE 0.9 % IV SOLN
12.5000 mg | Freq: Four times a day (QID) | INTRAVENOUS | Status: AC | PRN
  Administered 2020-09-01 – 2020-09-02 (×2): 12.5 mg via INTRAVENOUS
  Filled 2020-09-01: qty 0.5
  Filled 2020-09-01: qty 12.5
  Filled 2020-09-01: qty 0.5

## 2020-09-01 MED ORDER — METOPROLOL SUCCINATE ER 50 MG PO TB24: 50.0000 mg | ORAL_TABLET | Freq: Every day | ORAL | Status: DC

## 2020-09-01 MED ORDER — CLOPIDOGREL BISULFATE 75 MG PO TABS: 75.0000 mg | ORAL_TABLET | Freq: Every day | ORAL | Status: DC

## 2020-09-01 MED ORDER — ACETAMINOPHEN 325 MG PO TABS: 650.0000 mg | ORAL_TABLET | Freq: Four times a day (QID) | ORAL | Status: AC | PRN

## 2020-09-01 MED ORDER — POTASSIUM CHLORIDE CRYS ER 20 MEQ PO TBCR
40.0000 meq | EXTENDED_RELEASE_TABLET | Freq: Once | ORAL | Status: AC
  Administered 2020-09-02: 40 meq via ORAL
  Filled 2020-09-01: qty 2

## 2020-09-01 MED ORDER — ONDANSETRON HCL 4 MG/2ML IJ SOLN
INTRAMUSCULAR | Status: AC
  Administered 2020-09-01: 4 mg via INTRAVENOUS
  Filled 2020-09-01: qty 2

## 2020-09-01 MED ORDER — ONDANSETRON HCL 4 MG/2ML IJ SOLN: 4.0000 mg | Freq: Once | INTRAMUSCULAR | Status: AC

## 2020-09-01 MED ORDER — MAGNESIUM SULFATE 4 GM/100ML IV SOLN
4.0000 g | Freq: Once | INTRAVENOUS | Status: AC
  Administered 2020-09-01: 4 g via INTRAVENOUS
  Filled 2020-09-01: qty 100

## 2020-09-01 MED ORDER — ONDANSETRON HCL 4 MG/2ML IJ SOLN
4.0000 mg | Freq: Four times a day (QID) | INTRAMUSCULAR | Status: DC | PRN
  Administered 2020-09-02 – 2020-09-03 (×4): 4 mg via INTRAVENOUS
  Filled 2020-09-01 (×4): qty 2

## 2020-09-01 MED ORDER — SODIUM CHLORIDE 0.9 % IV SOLN
1.0000 g | Freq: Once | INTRAVENOUS | Status: AC
  Administered 2020-09-01: 1 g via INTRAVENOUS
  Filled 2020-09-01: qty 10

## 2020-09-01 MED ORDER — SODIUM CHLORIDE 0.9 % IV SOLN
500.0000 mg | INTRAVENOUS | Status: DC
  Administered 2020-09-02 – 2020-09-04 (×3): 500 mg via INTRAVENOUS
  Filled 2020-09-01 (×3): qty 500

## 2020-09-01 NOTE — ED Notes (Signed)
US at bedside

## 2020-09-01 NOTE — ED Notes (Signed)
Lab called and informed of mag add-on

## 2020-09-01 NOTE — H&P (Signed)
History and Physical   Dustin Acosta [facility]. XNA:[id number] DOB: 20-Jun-1924 DOA: 09/11/2020  PCP: Baxter Hire, MD  Outpatient Specialists: Dr. Ubaldo Glassing Patient coming from: home   I have personally briefly reviewed patient's old medical records in [place].  Chief Concern: chest pain  HPI: Dustin Acosta. is a 87 y.o. male with medical history significant for chronic atrial fibrillation, pulmonary hypertension, hypertension, hyperlipidemia, history of CAD status post PCI, last left heart cath was on 08/06/2014 with DES to mid RCA, presents to the emergency department for chief concerns of chest pain.  He report initially he developed nausea and vomiting, Friday, 08/25/2020, for one week. He endorses subjective fever that improved. He denies sick contacts. He reports that chest pain started after he had vomited multiple times.  He reports that he gets chest pain every time he vomits.  At bedside when he is not vomiting and at rest he does not have any chest pain.  He denies shortness of breath that is new from his baseline.  He denies changes in his diet.  Social history: he denies etoh use, he denies tobacco use. He is retired and formerly worked Sales executive for [employer]. He lives by himself.   Vaccination: two doses of Moderna   ROS: Constitutional: no weight change, no fever ENT/Mouth: no sore throat, no rhinorrhea Eyes: no eye pain, no vision changes Cardiovascular: + chest pain, no dyspnea,  no edema, no palpitations Respiratory: no cough, no sputum, no wheezing Gastrointestinal: + nausea, + vomiting, no diarrhea, no constipation Genitourinary: no urinary incontinence, no dysuria, no hematuria Musculoskeletal: no arthralgias, no myalgias Skin: no skin lesions, no pruritus, Neuro: + weakness, no loss of consciousness, no syncope Psych: no anxiety, no depression, + decrease appetite Heme/Lymph: no bruising, no bleeding  ED Course: Discussed with emergency medicine  provider, patient requiring hospitalization for chief concerns of multilobar pneumonia.  Vitals in the emergency department was remarkable for temperature of 98, respiration rate of 20, heart rate 94, initial blood pressure 97/57 and improved to 108/63, SPO2 of 97% on room air.  ED provider ordered D-dimer which was found to be elevated, 3.22.  ED provider followed up with the CT angio chest to assess for pulmonary embolism and it was read as no pulmonary embolism.  Patchy airspace opacity throughout the right lung compatible with multifocal lobar pneumonia.  Assessment/Plan  Principal Problem:   PNA (pneumonia) Active Problems:   A-fib (HCC)   Bilateral edema of lower extremity   HFrEF (heart failure with reduced ejection fraction) (HCC)   Hyperlipidemia   Peripheral vascular disease (HCC)   Hypokalemia   Gastroenteritis   Multilobar pneumonia in the right lung Meets sepsis criteria with elevated heart rate, increased lactic acid, respiration rate, source of infection being pneumonia - Blood cultures x2 -Patient is status post azithromycin and ceftriaxone per ED provider - We will continue azithromycin and ceftriaxone - Status post lactated Ringer 500 mL bolus -Lactated ringer 100 mL/h, 1 day ordered  Hypokalemia-suspect secondary to vomiting Nausea and vomiting-possible gastroenteritis versus pneumonia -Status post potassium 10 mEq, 4 doses ordered, potassium chloride 80 mEq p.o. once -Repeat BMP -Zofran p.o./IV for nausea and vomiting, Phenergan 12.5 mg IV for refractory nausea and vomiting  Hypomagnesemia-status post 1 g -We will check magnesium in the a.m.  Elevated troponin-patient does not have chest pain related to cardiac - Low clinical suspicion as patient's chest pain is brought on by family - Repeat high sensitive troponin  History  of coronary artery disease status post DES to mid RCA - Left heart cath in 08/06/2014 -Resumed aspirin, Plavix daily, metoprolol  succinate 50 mg daily, nitroglycerin as needed for chest pain  Atrial fibrillation-not on anticoagulation due to increased risk of bleeding -Metoprolol succinate  Hyperlipidemia-atorvastatin 40 mg nightly  Insomnia-melatonin 5 mg nightly.  For sleep  Chart reviewed.   DVT prophylaxis: Enoxaparin 40 mg subcutaneous every 24 hours Code Status: full code  Diet: Heart healthy Family Communication: Updated Randall An, daughter  Disposition Plan: Pending clinical course Consults called: None at this time Admission status: MedSurg, observation, 24 hours telemetry ordered  Past Medical History:  Diagnosis Date   A-fib ([place])    Cancer ([place])    skin / COLON   Coronary artery disease    Dysrhythmia    Edema    FEET/LEGS   Heart murmur    Hypertension    Myocardial infarction ([place])    Stroke ([place])    X 2   Past Surgical History:  Procedure Laterality Date   CARDIAC CATHETERIZATION N/A 08/06/2014   Procedure: Left Heart Cath and Coronary Angiography;  Surgeon: Charolette Forward, MD;  Location: [facility];  Service: Cardiovascular;  Laterality: N/A;   CATARACT EXTRACTION W/PHACO Left 04/21/2018   Procedure: CATARACT EXTRACTION PHACO AND INTRAOCULAR LENS PLACEMENT ([place]) LEFT;  Surgeon: Birder Robson, MD;  Location: [facility];  Service: Ophthalmology;  Laterality: Left;  [place] 01:50.9 CDE 17.73 Fluid Pack Lot # [id number]   COLON SURGERY     CORONARY ANGIOPLASTY     Social History:  reports that he has never smoked. He has never used smokeless tobacco. He reports that he does not drink alcohol and does not use drugs.  Allergies  Allergen Reactions   Other Rash and Shortness Of Breath    RED MEAT (BEEF and PORK)    Apixaban Itching and Rash   Warfarin Itching and Rash   No family history on file. Family history: Family history reviewed and not pertinent  Prior to Admission medications   Medication Sig Start Date End Date Taking? Authorizing Provider  amoxicillin (AMOXIL)  875 MG tablet Take 1 tablet (875 mg total) by mouth every 12 (twelve) hours. 05/14/19   Stoioff, Ronda Fairly, MD  aspirin EC 81 MG tablet Take 81 mg by mouth daily.    [provider]  atorvastatin (LIPITOR) 40 MG tablet Take 1 tablet (40 mg total) by mouth daily at 6 PM. 08/09/14   Charolette Forward, MD  augmented betamethasone dipropionate (DIPROLENE-AF) 0.05 % ointment Apply 1 application topically 2 (two) times daily. 07/03/18   [provider]  clopidogrel (PLAVIX) 75 MG tablet Take 1 tablet (75 mg total) by mouth daily. 08/09/14   Charolette Forward, MD  cyanocobalamin 1000 MCG tablet Take by mouth. 09/03/17   [provider]  diphenhydrAMINE (BENADRYL) 25 mg capsule Take 1 capsule (25 mg total) by mouth every 6 (six) hours as needed. 03/28/15   Carrie Mew, MD  EPINEPHrine 0.3 mg/0.3 mL IJ SOAJ injection Inject 0.3 mLs (0.3 mg total) into the muscle once. Follow package instructions as needed for severe allergy or anaphylactic reaction. 03/28/15   Carrie Mew, MD  furosemide (LASIX) 40 MG tablet Take 80 mg by mouth daily. with food 03/09/19   [provider]  metolazone (ZAROXOLYN) 5 MG tablet Take 5 mg by mouth daily. 05/18/18 05/18/19  [provider]  metoprolol succinate (TOPROL-XL) 50 MG 24 hr tablet Take 1 tablet (50 mg total)  by mouth daily. 08/09/14   Charolette Forward, MD  Multiple Vitamin (MULTIVITAMIN) tablet Take 1 tablet by mouth daily.    [provider]  nitroGLYCERIN (NITROSTAT) 0.4 MG SL tablet Place 1 tablet (0.4 mg total) under the tongue every 5 (five) minutes x 3 doses as needed for chest pain. 08/09/14   Charolette Forward, MD  potassium chloride SA (K-DUR) 20 MEQ tablet Take 20 mEq by mouth daily. 05/26/18 05/26/19  [provider]  triamcinolone ointment (KENALOG) 0.1 % Apply 1 application topically 2 (two) times daily as needed (for itching).  06/23/14   [provider]   Physical Exam: Vitals:   09/03/2020 1530 08/26/2020  1600 09/04/2020 1801 08/31/2020 1913  BP: 114/88 120/63 112/65 102/69  Pulse: 93 91 95 80  Resp: 20 17 17 18   Temp:   (!) 97.4 F (36.3 C) 97.8 F (36.6 C)  TempSrc:   Oral   SpO2: 93% 95% 93% 93%  Weight:      Height:       Constitutional: appears frail, cachectic, NAD, calm, comfortable Eyes: PERRL, lids and conjunctivae normal ENMT: Mucous membranes are moist. Posterior pharynx clear of any exudate or lesions. Age-appropriate dentition. Hearing loss Neck: normal, supple, no masses, no thyromegaly Respiratory: clear to auscultation bilaterally, no wheezing, no crackles. Normal respiratory effort. No accessory muscle use.  Cardiovascular: Regular rate and rhythm, no murmurs / rubs / gallops. No extremity edema. 2+ pedal pulses. No carotid bruits.  Abdomen: no tenderness, no masses palpated, no hepatosplenomegaly. Bowel sounds positive.  Musculoskeletal: no clubbing / cyanosis. No joint deformity upper and lower extremities. Good ROM, no contractures, no atrophy. Normal muscle tone.  Skin: no rashes, lesions, ulcers. No induration Neurologic: Sensation intact. Strength 5/5 in all 4.  Psychiatric: Normal judgment and insight. Alert and oriented x 3. Normal mood.   EKG: independently reviewed, showing atrial fibrillation with rate of 111, QTc 06/23/1968  Chest x-ray on Admission: I personally reviewed and I agree with radiologist reading as below.  DG Chest 2 View  Result Date: 08/27/2020 CLINICAL DATA:  Left chest pain. EXAM: CHEST - 2 VIEW COMPARISON:  06/02/2019 FINDINGS: Cardiac enlargement. Normal vascularity. Atherosclerotic calcification aorta Patchy infiltrate in the right mid and lower lung zone, not present previously. Suspicious for pneumonia. No significant pleural effusion. IMPRESSION: Right lower lobe infiltrate, probable pneumonia Electronically Signed   By: Franchot Gallo M.D.   On: 09/03/2020 14:56   CT Angio Chest PE W and/or Wo Contrast  Result Date: 08/19/2020 CLINICAL  DATA:  The left-sided chest pain beginning last night. Vomiting. EXAM: CT ANGIOGRAPHY CHEST WITH CONTRAST TECHNIQUE: Multidetector CT imaging of the chest was performed using the standard protocol during bolus administration of intravenous contrast. Multiplanar CT image reconstructions and MIPs were obtained to evaluate the vascular anatomy. CONTRAST:  71mL OMNIPAQUE IOHEXOL 350 MG/ML SOLN COMPARISON:  CT chest x-ray a 09/09/2020 CT a chest 07/16/2018 FINDINGS: Cardiovascular: Heart is mildly enlarged, stable. Coronary artery calcifications again noted. Right atrium enlarged. Atherosclerotic calcifications are present the aortic arch and great vessel origins without aneurysm or stenosis. Pulmonary artery opacification is excellent. No significant filling defects are present. Pulmonary artery size is normal. Mediastinum/Nodes: No significant mediastinal, hilar, or axillary adenopathy is present. Esophagus is somewhat dilated and fluid-filled. Large distal hiatal hernia is again noted, fluid-filled. Lungs/Pleura: Patchy airspace opacities are present throughout the right lung. Left lung is clear. Airways are patent. No significant pleural disease is present. Upper Abdomen: Upper abdomen is unremarkable. Musculoskeletal:  Exaggerated thoracic kyphosis is again seen. Vertebral body heights maintained. Alignment is anatomic. Review of the MIP images confirms the above findings. IMPRESSION: 1. No pulmonary embolus. 2. Patchy airspace opacities throughout the right lung compatible with multi lobar pneumonia. 3. Stable cardiomegaly without failure. 4. Coronary artery disease. 5. Large distal hiatal hernia, fluid-filled. 6. Exaggerated thoracic kyphosis. 7. Aortic Atherosclerosis (ICD10-I70.0). Electronically Signed   By: San Morelle M.D.   On: 08/31/2020 16:16   US ABDOMEN LIMITED RUQ (LIVER/GB)  Result Date: 08/28/2020 CLINICAL DATA:  Elevated bilirubin EXAM: ULTRASOUND ABDOMEN LIMITED RIGHT UPPER QUADRANT  COMPARISON:  07/16/2018 FINDINGS: Gallbladder: No gallstones or wall thickening visualized. No sonographic Murphy sign noted by sonographer. Common bile duct: Diameter: 3.9 mm. Liver: Increased echogenicity with heterogeneity consistent with fatty infiltration. Portal vein is patent on color Doppler imaging with normal direction of blood flow towards the liver. Other: Stomach is distended with fluid. IMPRESSION: Fatty liver.  No acute abnormality noted. Electronically Signed   By: Inez Catalina M.D.   On: 09/12/2020 17:25    Labs on Admission: I have personally reviewed following labs  CBC: Recent Labs  Lab 08/16/2020 1402  WBC 9.7  HGB 14.2  HCT 39.8  MCV 92.8  PLT 694*   Basic Metabolic Panel: Recent Labs  Lab 08/22/2020 1402 09/12/2020 1405  NA 133*  --   K 2.2*  --   CL 83*  --   CO2 35*  --   GLUCOSE 111*  --   BUN 27*  --   CREATININE 0.97  --   CALCIUM 10.6*  --   MG  --  1.5*   GFR: Estimated Creatinine Clearance: 46 mL/min (by C-G formula based on SCr of 0.97 mg/dL).  Liver Function Tests: Recent Labs  Lab 09/07/2020 1405  AST 42*  ALT 22  ALKPHOS 94  BILITOT 2.7*  PROT 7.1  ALBUMIN 3.6   Recent Labs  Lab 08/31/2020 1405  LIPASE 39   Urine analysis:    Component Value Date/Time   COLORURINE YELLOW (A) 07/16/2018 1501   APPEARANCEUR Clear 05/20/2019 1406   LABSPEC 1.014 07/16/2018 1501   PHURINE 6.0 07/16/2018 1501   GLUCOSEU Negative 05/20/2019 1406   HGBUR NEGATIVE 07/16/2018 1501   BILIRUBINUR Negative 05/20/2019 1406   KETONESUR NEGATIVE 07/16/2018 1501   PROTEINUR Negative 05/20/2019 1406   PROTEINUR NEGATIVE 07/16/2018 1501   NITRITE Negative 05/20/2019 1406   NITRITE NEGATIVE 07/16/2018 1501   LEUKOCYTESUR Trace (A) 05/20/2019 1406   LEUKOCYTESUR NEGATIVE 07/16/2018 1501   Iyanna Drummer N Merik Mignano D.O. [facility]  If 7PM-7AM, please contact overnight-coverage provider If 7AM-7PM, please contact day coverage provider [email or web address]  08/24/2020, 9:20  PM

## 2020-09-01 NOTE — ED Triage Notes (Signed)
Pt reports that he developed left sided chest pain last night. Pt denies SOB or diaphoresis but has had some vomiting. Denies any radiation of chest pain. Pt feels warm to touch

## 2020-09-01 NOTE — ED Provider Notes (Addendum)
[facility] Emergency Department Provider Note  ____________________________________________   Event Date/Time   First MD Initiated Contact with Patient 08/29/2020 1433     (approximate)  I have reviewed the triage vital signs and the nursing notes.   HISTORY  Chief Complaint Chest Pain and Emesis   HPI Dustin Tesch. is a 87 y.o. male with a past medical history of CAD, chronic A. fib not anticoagulated rate controlled on metoprolol, HTN, HDL, and pulmonary artery hypertension who presents for assessment of some chest pain that started at night.  Patient states he has had a little bit of nausea and vomiting worsening over the last week but feels it is gotten much worse today and did not have any chest pain for last night.  He denies cough or shortness of breath, diarrhea, constipation, urinary symptoms, abdominal pain, back pain, rash or extremity pain.  No recent falls or injuries.  He states he has not had any measured temperatures but thinks he may have been a little warm.  No new headache area, sore throat or other acute sick symptoms.  He states he thinks he may have had food poisoning from some [place] he had last week.         Past Medical History:  Diagnosis Date   A-fib ([place])    Cancer ([place])    skin / COLON   Coronary artery disease    Dysrhythmia    Edema    FEET/LEGS   Heart murmur    Hypertension    Myocardial infarction [facility])    Stroke [facility])    X 2    Patient Active Problem List   Diagnosis Date Noted   Cancer of sigmoid colon ([place]) 04/16/2019   Gout 04/16/2019   Stones, prostate 04/16/2019   HFrEF (heart failure with reduced ejection fraction) ([place]) 03/08/2019   Thrombocytopenia ([place]) 12/09/2018   Peripheral vascular disease ([place]) 12/08/2018   H/O macrocytic anemia 09/03/2017   Bilateral edema of lower extremity 12/19/2014   Coronary artery disease of native artery of native heart with stable angina pectoris ([place])  10/03/2014   Acute MI, inferoposterior wall ([place]) 08/06/2014   A-fib ([place]) 12/10/2013   Hyperlipidemia 12/10/2013   Cerebral infarction ([place]) 07/16/2012    Past Surgical History:  Procedure Laterality Date   CARDIAC CATHETERIZATION N/A 08/06/2014   Procedure: Left Heart Cath and Coronary Angiography;  Surgeon: Charolette Forward, MD;  Location: [place] CV LAB;  Service: Cardiovascular;  Laterality: N/A;   CATARACT EXTRACTION W/PHACO Left 04/21/2018   Procedure: CATARACT EXTRACTION PHACO AND INTRAOCULAR LENS PLACEMENT ([place]) LEFT;  Surgeon: Birder Robson, MD;  Location: [facility] ORS;  Service: Ophthalmology;  Laterality: Left;  [place] 01:50.9 CDE 17.73 Fluid Pack Lot # [id number]   COLON SURGERY     CORONARY ANGIOPLASTY      Prior to Admission medications   Medication Sig Start Date End Date Taking? Authorizing Provider  amoxicillin (AMOXIL) 875 MG tablet Take 1 tablet (875 mg total) by mouth every 12 (twelve) hours. 05/14/19   Stoioff, Ronda Fairly, MD  aspirin EC 81 MG tablet Take 81 mg by mouth daily.    [provider]  atorvastatin (LIPITOR) 40 MG tablet Take 1 tablet (40 mg total) by mouth daily at 6 PM. 08/09/14   Charolette Forward, MD  augmented betamethasone dipropionate (DIPROLENE-AF) 0.05 % ointment Apply 1 application topically 2 (two) times daily. 07/03/18   [provider]  clopidogrel (PLAVIX) 75 MG tablet Take 1 tablet (  75 mg total) by mouth daily. 08/09/14   Charolette Forward, MD  cyanocobalamin 1000 MCG tablet Take by mouth. 09/03/17   [provider]  diphenhydrAMINE (BENADRYL) 25 mg capsule Take 1 capsule (25 mg total) by mouth every 6 (six) hours as needed. 03/28/15   Carrie Mew, MD  EPINEPHrine 0.3 mg/0.3 mL IJ SOAJ injection Inject 0.3 mLs (0.3 mg total) into the muscle once. Follow package instructions as needed for severe allergy or anaphylactic reaction. 03/28/15   Carrie Mew, MD  furosemide (LASIX) 40 MG tablet Take 80 mg by mouth daily. with food  03/09/19   [provider]  metolazone (ZAROXOLYN) 5 MG tablet Take 5 mg by mouth daily. 05/18/18 05/18/19  [provider]  metoprolol succinate (TOPROL-XL) 50 MG 24 hr tablet Take 1 tablet (50 mg total) by mouth daily. 08/09/14   Charolette Forward, MD  Multiple Vitamin (MULTIVITAMIN) tablet Take 1 tablet by mouth daily.    [provider]  nitroGLYCERIN (NITROSTAT) 0.4 MG SL tablet Place 1 tablet (0.4 mg total) under the tongue every 5 (five) minutes x 3 doses as needed for chest pain. 08/09/14   Charolette Forward, MD  potassium chloride SA (K-DUR) 20 MEQ tablet Take 20 mEq by mouth daily. 05/26/18 05/26/19  [provider]  triamcinolone ointment (KENALOG) 0.1 % Apply 1 application topically 2 (two) times daily as needed (for itching).  06/23/14   [provider]    Allergies Other, Apixaban, and Warfarin  No family history on file.  Social History Social History   Tobacco Use   Smoking status: Never   Smokeless tobacco: Never  Substance Use Topics   Alcohol use: No   Drug use: No    Review of Systems  Review of Systems  Constitutional:  Negative for chills and fever.  HENT:  Negative for sore throat.   Eyes:  Negative for pain.  Respiratory:  Negative for cough and stridor.   Cardiovascular:  Positive for chest pain.  Gastrointestinal:  Positive for nausea and vomiting.  Genitourinary:  Negative for dysuria.  Musculoskeletal:  Negative for myalgias.  Skin:  Negative for rash.  Neurological:  Negative for seizures, loss of consciousness and headaches.  Psychiatric/Behavioral:  Negative for suicidal ideas.   All other systems reviewed and are negative.    ____________________________________________   PHYSICAL EXAM:  VITAL SIGNS: ED Triage Vitals  Enc Vitals Group     BP 08/20/2020 1400 (!) 97/57     Pulse Rate 09/07/2020 1400 (!) 122     Resp 08/17/2020 1400 20     Temp 08/31/2020 1400 98 F (36.7 C)     Temp Source 08/16/2020 1400 Oral      SpO2 09/09/2020 1400 94 %     Weight 09/11/2020 1400 162 lb 0.6 oz (73.5 kg)     Height 08/16/2020 1400 5\' 8"  (1.727 m)     Head Circumference --      Peak Flow --      Pain Score 08/24/2020 1359 5     Pain Loc --      Pain Edu? --      Excl. in [place]? --    Vitals:   08/31/2020 1530 09/06/2020 1600  BP: 114/88 120/63  Pulse: 93 91  Resp: 20 17  Temp:    SpO2: 93% 95%   Physical Exam Vitals and nursing note reviewed.  Constitutional:      Appearance: He is well-developed.  HENT:     Head: Normocephalic  and atraumatic.     Right Ear: External ear normal.     Left Ear: External ear normal.     Nose: Nose normal.  Eyes:     Conjunctiva/sclera: Conjunctivae normal.  Cardiovascular:     Rate and Rhythm: Tachycardia present. Rhythm irregular.     Heart sounds: No murmur heard. Pulmonary:     Effort: Pulmonary effort is normal. No respiratory distress.     Breath sounds: Normal breath sounds.  Abdominal:     Palpations: Abdomen is soft.     Tenderness: There is no abdominal tenderness.  Musculoskeletal:     Cervical back: Neck supple.     Right lower leg: Edema present.     Left lower leg: Edema present.  Skin:    General: Skin is warm and dry.  Neurological:     Mental Status: He is alert and oriented to person, place, and time.  Psychiatric:        Mood and Affect: Mood normal.     ____________________________________________   LABS (all labs ordered are listed, but only abnormal results are displayed)  Labs Reviewed  BASIC METABOLIC PANEL - Abnormal; Notable for the following components:      Result Value   Sodium 133 (*)    Potassium 2.2 (*)    Chloride 83 (*)    CO2 35 (*)    Glucose, Bld 111 (*)    BUN 27 (*)    Calcium 10.6 (*)    All other components within normal limits  CBC - Abnormal; Notable for the following components:   Platelets 111 (*)    All other components within normal limits  LACTIC ACID, PLASMA - Abnormal; Notable for the following components:    Lactic Acid, Venous 2.8 (*)    All other components within normal limits  LACTIC ACID, PLASMA - Abnormal; Notable for the following components:   Lactic Acid, Venous 2.5 (*)    All other components within normal limits  D-DIMER, QUANTITATIVE - Abnormal; Notable for the following components:   D-Dimer, Quant 3.22 (*)    All other components within normal limits  HEPATIC FUNCTION PANEL - Abnormal; Notable for the following components:   AST 42 (*)    Total Bilirubin 2.7 (*)    Bilirubin, Direct 0.8 (*)    Indirect Bilirubin 1.9 (*)    All other components within normal limits  MAGNESIUM - Abnormal; Notable for the following components:   Magnesium 1.5 (*)    All other components within normal limits  TROPONIN I (HIGH SENSITIVITY) - Abnormal; Notable for the following components:   Troponin I (High Sensitivity) 18 (*)    All other components within normal limits  RESP PANEL BY RT-PCR (FLU A&B, COVID) ARPGX2  CULTURE, BLOOD (ROUTINE X 2)  CULTURE, BLOOD (ROUTINE X 2)  LIPASE, BLOOD  TROPONIN I (HIGH SENSITIVITY)   ____________________________________________  EKG  A. fib with a ventricular rate of 111, incomplete right bundle branch block and some multiple nonspecific ST changes in lateral and inferior leads.  Effect in inferior leads.  No other clear evidence of acute ischemia.  Otherwise largely unremarkable intervals.  CT angio chest without evidence of PE but bilateral opacities consistent with multilobar pneumonia.  Stable cardiomegaly without overt edema and there is evidence of CAD.  There is also a large hiatal hernia but no other clear acute thoracic process. ____________________________________________  RADIOLOGY  ED MD interpretation: Chest x-ray with some right lower lobe infiltrate concerning for pneumonia.  No overt edema, large effusion, pneumothorax or other clear acute intrathoracic process.  Official radiology report(s): DG Chest 2 View  Result Date:  08/25/2020 CLINICAL DATA:  Left chest pain. EXAM: CHEST - 2 VIEW COMPARISON:  06/02/2019 FINDINGS: Cardiac enlargement. Normal vascularity. Atherosclerotic calcification aorta Patchy infiltrate in the right mid and lower lung zone, not present previously. Suspicious for pneumonia. No significant pleural effusion. IMPRESSION: Right lower lobe infiltrate, probable pneumonia Electronically Signed   By: Franchot Gallo M.D.   On: 08/21/2020 14:56   CT Angio Chest PE W and/or Wo Contrast  Result Date: 08/21/2020 CLINICAL DATA:  The left-sided chest pain beginning last night. Vomiting. EXAM: CT ANGIOGRAPHY CHEST WITH CONTRAST TECHNIQUE: Multidetector CT imaging of the chest was performed using the standard protocol during bolus administration of intravenous contrast. Multiplanar CT image reconstructions and MIPs were obtained to evaluate the vascular anatomy. CONTRAST:  3mL OMNIPAQUE IOHEXOL 350 MG/ML SOLN COMPARISON:  CT chest x-ray a 08/31/2020 CT a chest 07/16/2018 FINDINGS: Cardiovascular: Heart is mildly enlarged, stable. Coronary artery calcifications again noted. Right atrium enlarged. Atherosclerotic calcifications are present the aortic arch and great vessel origins without aneurysm or stenosis. Pulmonary artery opacification is excellent. No significant filling defects are present. Pulmonary artery size is normal. Mediastinum/Nodes: No significant mediastinal, hilar, or axillary adenopathy is present. Esophagus is somewhat dilated and fluid-filled. Large distal hiatal hernia is again noted, fluid-filled. Lungs/Pleura: Patchy airspace opacities are present throughout the right lung. Left lung is clear. Airways are patent. No significant pleural disease is present. Upper Abdomen: Upper abdomen is unremarkable. Musculoskeletal: Exaggerated thoracic kyphosis is again seen. Vertebral body heights maintained. Alignment is anatomic. Review of the MIP images confirms the above findings. IMPRESSION: 1. No pulmonary  embolus. 2. Patchy airspace opacities throughout the right lung compatible with multi lobar pneumonia. 3. Stable cardiomegaly without failure. 4. Coronary artery disease. 5. Large distal hiatal hernia, fluid-filled. 6. Exaggerated thoracic kyphosis. 7. Aortic Atherosclerosis (ICD10-I70.0). Electronically Signed   By: San Morelle M.D.   On: 09/06/2020 16:16   US ABDOMEN LIMITED RUQ (LIVER/GB)  Result Date: 09/06/2020 CLINICAL DATA:  Elevated bilirubin EXAM: ULTRASOUND ABDOMEN LIMITED RIGHT UPPER QUADRANT COMPARISON:  07/16/2018 FINDINGS: Gallbladder: No gallstones or wall thickening visualized. No sonographic Murphy sign noted by sonographer. Common bile duct: Diameter: 3.9 mm. Liver: Increased echogenicity with heterogeneity consistent with fatty infiltration. Portal vein is patent on color Doppler imaging with normal direction of blood flow towards the liver. Other: Stomach is distended with fluid. IMPRESSION: Fatty liver.  No acute abnormality noted. Electronically Signed   By: Inez Catalina M.D.   On: 09/13/2020 17:25    ____________________________________________   PROCEDURES  Procedure(s) performed (including Critical Care):  .1-3 Lead EKG Interpretation  Date/Time: 08/19/2020 5:31 PM Performed by: Lucrezia Starch, MD Authorized by: Lucrezia Starch, MD     Interpretation: abnormal     ECG rate assessment: tachycardic     Rhythm: atrial fibrillation     Ectopy: none     ____________________________________________   INITIAL IMPRESSION / ASSESSMENT AND PLAN / ED COURSE     Patient presents with above to history exam for assessment of some left-sided chest pain associate worsening nausea and nonbloody vomiting over the last week.  On arrival patient heart rate is known to be 122 with a BP of 97/57 with a stable vital signs on room air.  Primary differential includes ACS, PE, metabolic derangements and compensatory tachycardia secondary to GI losses from infectious  gastritis, pneumonia, anemia,  viral bronchitis versus myocarditis versus pericarditis.   BMP remarkable for K of 2.2 without any other significant electrolyte or metabolic derangements.  CBC without leukocytosis or acute anemia.  Initial troponin not elevated and although some nonspecific findings on ECG given troponin obtained greater than 2 hours after symptom onset Evalose suspicion for ACS or myocarditis.  D-dimer is elevated 3.222.  I will obtain a CTA to rule out PE.  Hepatic function panel shows evidence of hepatitis.  T bili is 2.7 still elevated from 1.8.  Lipase 39 not consistent with acute pancreatitis.  Magnesium 1.5.  Initial lactic acid 2.8  CT angio chest without evidence of PE but bilateral opacities consistent with multilobar pneumonia.  Stable cardiomegaly without overt edema and there is evidence of CAD.  There is also a large hiatal hernia but no other clear acute thoracic process.  Been mildly elevated bilirubin" ultrasound obtained shows no evidence of cholecystitis or cholecystitis.  Impression is metabolic derangements and dehydration causing A. fib with RVR likely secondary to acute community-acquired pneumonia.  Patient hydrated given magnesium and potassium as well as IV antibiotics and blood cultures were obtained.  Actiq acid did downtrend from 2.8-2.5.  However on reassessment on trial standing patient started vomiting again heart rate went back up to 115 and his SPO2 dropped to 89%.  His heart rate returned back to the 90s and his SPO2 returned back to 95% when he laid back in bed.  I will admit to medicine service for further evaluation and management.  ____________________________________________   FINAL CLINICAL IMPRESSION(S) / ED DIAGNOSES  Final diagnoses:  Chest pain, unspecified type  Nausea and vomiting, intractability of vomiting not specified, unspecified vomiting type  Hypokalemia  Bilirubinemia  Pneumonia of right lung due to infectious organism,  unspecified part of lung  Atrial fibrillation with RVR ([place])    Medications  potassium chloride 10 mEq in 100 mL IVPB (10 mEq Intravenous New Bag/Given 09/08/2020 1652)  magnesium sulfate IVPB 4 g 100 mL (4 g Intravenous New Bag/Given 08/29/2020 1630)  azithromycin (ZITHROMAX) 500 mg in sodium chloride 0.9 % 250 mL IVPB (has no administration in time range)  ondansetron (ZOFRAN) injection 4 mg (has no administration in time range)  ondansetron (ZOFRAN) 4 MG/2ML injection (has no administration in time range)  lactated ringers bolus 500 mL (500 mLs Intravenous New Bag/Given 09/11/2020 1511)  ondansetron (ZOFRAN) injection 4 mg (4 mg Intravenous Given 08/30/2020 1510)  potassium chloride SA (KLOR-CON) CR tablet 80 mEq (80 mEq Oral Given 08/31/2020 1519)  iohexol (OMNIPAQUE) 350 MG/ML injection 75 mL (75 mLs Intravenous Contrast Given 09/04/2020 1542)  cefTRIAXone (ROCEPHIN) 1 g in sodium chloride 0.9 % 100 mL IVPB (1 g Intravenous New Bag/Given 08/24/2020 1634)     ED Discharge Orders     None        Note:  This document was prepared using Dragon voice recognition software and may include unintentional dictation errors.    Lucrezia Starch, MD 09/02/2020 1731    Lucrezia Starch, MD 08/29/2020 [id number]

## 2020-09-01 NOTE — Progress Notes (Signed)
Critical lab value 2.5 potassium. Dr. Tobie Poet notified, see new orders for replacement.

## 2020-09-01 NOTE — Progress Notes (Signed)
Skin swarm completed with Debi RN. Patient with multiple episodes of emesis on arrival, associated with moving. Ruddy discoloration to bilateral lower extremities with non pitting edema. No other skin issues identified. Cox, MD updated. Pt oriented to room and call light system. Family at bedside.

## 2020-09-01 NOTE — ED Notes (Signed)
Informed RN bed assigned 

## 2020-09-02 ENCOUNTER — Observation Stay: Payer: Medicare Other

## 2020-09-02 DIAGNOSIS — R64 Cachexia: Secondary | ICD-10-CM | POA: Diagnosis present

## 2020-09-02 DIAGNOSIS — I10 Essential (primary) hypertension: Secondary | ICD-10-CM | POA: Diagnosis present

## 2020-09-02 DIAGNOSIS — E860 Dehydration: Secondary | ICD-10-CM | POA: Diagnosis present

## 2020-09-02 DIAGNOSIS — E871 Hypo-osmolality and hyponatremia: Secondary | ICD-10-CM | POA: Diagnosis present

## 2020-09-02 DIAGNOSIS — K920 Hematemesis: Secondary | ICD-10-CM | POA: Diagnosis not present

## 2020-09-02 DIAGNOSIS — I2510 Atherosclerotic heart disease of native coronary artery without angina pectoris: Secondary | ICD-10-CM | POA: Diagnosis present

## 2020-09-02 DIAGNOSIS — J189 Pneumonia, unspecified organism: Secondary | ICD-10-CM | POA: Diagnosis present

## 2020-09-02 DIAGNOSIS — I4821 Permanent atrial fibrillation: Secondary | ICD-10-CM | POA: Diagnosis not present

## 2020-09-02 DIAGNOSIS — R54 Age-related physical debility: Secondary | ICD-10-CM | POA: Diagnosis present

## 2020-09-02 DIAGNOSIS — R079 Chest pain, unspecified: Secondary | ICD-10-CM | POA: Diagnosis present

## 2020-09-02 DIAGNOSIS — R112 Nausea with vomiting, unspecified: Secondary | ICD-10-CM | POA: Diagnosis present

## 2020-09-02 DIAGNOSIS — I272 Pulmonary hypertension, unspecified: Secondary | ICD-10-CM | POA: Diagnosis present

## 2020-09-02 DIAGNOSIS — R944 Abnormal results of kidney function studies: Secondary | ICD-10-CM | POA: Diagnosis present

## 2020-09-02 DIAGNOSIS — Z955 Presence of coronary angioplasty implant and graft: Secondary | ICD-10-CM | POA: Diagnosis not present

## 2020-09-02 DIAGNOSIS — I739 Peripheral vascular disease, unspecified: Secondary | ICD-10-CM | POA: Diagnosis present

## 2020-09-02 DIAGNOSIS — Z8673 Personal history of transient ischemic attack (TIA), and cerebral infarction without residual deficits: Secondary | ICD-10-CM | POA: Diagnosis not present

## 2020-09-02 DIAGNOSIS — Z20822 Contact with and (suspected) exposure to covid-19: Secondary | ICD-10-CM | POA: Diagnosis present

## 2020-09-02 DIAGNOSIS — Z66 Do not resuscitate: Secondary | ICD-10-CM | POA: Diagnosis not present

## 2020-09-02 DIAGNOSIS — E785 Hyperlipidemia, unspecified: Secondary | ICD-10-CM | POA: Diagnosis present

## 2020-09-02 DIAGNOSIS — E876 Hypokalemia: Secondary | ICD-10-CM | POA: Diagnosis present

## 2020-09-02 DIAGNOSIS — K529 Noninfective gastroenteritis and colitis, unspecified: Secondary | ICD-10-CM | POA: Diagnosis present

## 2020-09-02 DIAGNOSIS — I482 Chronic atrial fibrillation, unspecified: Secondary | ICD-10-CM | POA: Diagnosis present

## 2020-09-02 DIAGNOSIS — A419 Sepsis, unspecified organism: Secondary | ICD-10-CM | POA: Diagnosis present

## 2020-09-02 DIAGNOSIS — Z515 Encounter for palliative care: Secondary | ICD-10-CM | POA: Diagnosis not present

## 2020-09-02 DIAGNOSIS — I4891 Unspecified atrial fibrillation: Secondary | ICD-10-CM | POA: Diagnosis not present

## 2020-09-02 DIAGNOSIS — J9601 Acute respiratory failure with hypoxia: Secondary | ICD-10-CM | POA: Diagnosis present

## 2020-09-02 DIAGNOSIS — G4700 Insomnia, unspecified: Secondary | ICD-10-CM | POA: Diagnosis present

## 2020-09-02 LAB — CBC
HCT: 40.1 % (ref 39.0–52.0)
Hemoglobin: 13.9 g/dL (ref 13.0–17.0)
MCH: 32.4 pg (ref 26.0–34.0)
MCHC: 34.7 g/dL (ref 30.0–36.0)
MCV: 93.5 fL (ref 80.0–100.0)
Platelets: 99 10*3/uL — ABNORMAL LOW (ref 150–400)
RBC: 4.29 MIL/uL (ref 4.22–5.81)
RDW: 14.6 % (ref 11.5–15.5)
WBC: 8.2 10*3/uL (ref 4.0–10.5)
nRBC: 0 % (ref 0.0–0.2)

## 2020-09-02 LAB — BASIC METABOLIC PANEL
Anion gap: 12 (ref 5–15)
BUN: 29 mg/dL — ABNORMAL HIGH (ref 8–23)
CO2: 34 mmol/L — ABNORMAL HIGH (ref 22–32)
Calcium: 10.4 mg/dL — ABNORMAL HIGH (ref 8.9–10.3)
Chloride: 82 mmol/L — ABNORMAL LOW (ref 98–111)
Creatinine, Ser: 0.9 mg/dL (ref 0.61–1.24)
GFR, Estimated: >60 mL/min (ref >60)
Glucose, Bld: 112 mg/dL — ABNORMAL HIGH (ref 70–99)
Potassium: 3.2 mmol/L — ABNORMAL LOW (ref 3.5–5.1)
Sodium: 128 mmol/L — ABNORMAL LOW (ref 135–145)

## 2020-09-02 LAB — MAGNESIUM: Magnesium: 2.4 mg/dL (ref 1.7–2.4)

## 2020-09-02 MED ORDER — PANTOPRAZOLE INFUSION (NEW) - SIMPLE MED
8.0000 mg/h | INTRAVENOUS | Status: DC
  Administered 2020-09-02 – 2020-09-04 (×5): 8 mg/h via INTRAVENOUS
  Filled 2020-09-02 (×2): qty 100
  Filled 2020-09-02: qty 80
  Filled 2020-09-02: qty 100
  Filled 2020-09-02: qty 80

## 2020-09-02 MED ORDER — PANTOPRAZOLE 80MG IVPB - SIMPLE MED
80.0000 mg | Freq: Once | INTRAVENOUS | Status: AC
  Administered 2020-09-02: 80 mg via INTRAVENOUS
  Filled 2020-09-02: qty 80

## 2020-09-02 MED ORDER — POTASSIUM CHLORIDE 10 MEQ/100ML IV SOLN
10.0000 meq | INTRAVENOUS | Status: AC
  Administered 2020-09-02 (×3): 10 meq via INTRAVENOUS
  Filled 2020-09-02: qty 100

## 2020-09-02 MED ORDER — SODIUM CHLORIDE 0.9 % IV SOLN: INTRAVENOUS | Status: DC

## 2020-09-02 MED ORDER — METOPROLOL TARTRATE 5 MG/5ML IV SOLN
5.0000 mg | Freq: Four times a day (QID) | INTRAVENOUS | Status: DC
  Administered 2020-09-02 – 2020-09-03 (×5): 5 mg via INTRAVENOUS
  Filled 2020-09-02 (×4): qty 5

## 2020-09-02 MED ORDER — PROCHLORPERAZINE EDISYLATE 10 MG/2ML IJ SOLN
5.0000 mg | Freq: Once | INTRAMUSCULAR | Status: AC
  Administered 2020-09-02: 5 mg via INTRAVENOUS
  Filled 2020-09-02: qty 1

## 2020-09-02 MED ORDER — PROCHLORPERAZINE EDISYLATE 10 MG/2ML IJ SOLN
5.0000 mg | Freq: Four times a day (QID) | INTRAMUSCULAR | Status: DC | PRN
  Administered 2020-09-02 – 2020-09-03 (×2): 5 mg via INTRAVENOUS
  Filled 2020-09-02 (×4): qty 1

## 2020-09-02 MED ORDER — POTASSIUM CHLORIDE CRYS ER 20 MEQ PO TBCR: 40.0000 meq | EXTENDED_RELEASE_TABLET | Freq: Once | ORAL | Status: DC

## 2020-09-02 MED ORDER — PANTOPRAZOLE SODIUM 40 MG IV SOLR: 40.0000 mg | Freq: Two times a day (BID) | INTRAVENOUS | Status: DC

## 2020-09-02 MED ORDER — METOCLOPRAMIDE HCL 5 MG/ML IJ SOLN
5.0000 mg | Freq: Once | INTRAMUSCULAR | Status: AC
  Administered 2020-09-02: 5 mg via INTRAVENOUS
  Filled 2020-09-02: qty 2

## 2020-09-02 NOTE — Progress Notes (Signed)
Progress Note    Dustin Acosta [facility].  QMG:[id number] DOB: September 23, 1924  DOA: 08/24/2020 PCP: Baxter Hire, MD    Brief Narrative:     Medical records reviewed and are as summarized below:  Dustin Acosta. is an 87 y.o. male with medical history significant for chronic atrial fibrillation, pulmonary hypertension, hypertension, hyperlipidemia, history of CAD status post PCI, last left heart cath was on 08/06/2014 with DES to mid RCA, presents to the emergency department for chief concerns of chest pain.    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 -GI consult  Multilobar pneumonia in the right lung Meets sepsis criteria with elevated heart rate, increased lactic acid, respiration rate, source of infection being pneumonia - Blood cultures x2 -Patient is status post azithromycin and ceftriaxone per ED provider - We will continue azithromycin and ceftriaxone   Hypomagnesemia/hypokalemia -replete   History of coronary artery disease status post DES to mid RCA - Left heart cath in 08/06/2014 -hold ASA/plavix until able to take PO and no need for procedure   Atrial fibrillation with RVR-not on anticoagulation due to increased risk of bleeding -Metoprolol succinate not been given due to N/V -IV BB until able to take PO   Hyperlipidemia -statin when able to take PO   Insomnia-melatonin 5 mg nightly.  For sleep  Elevated calcium -IVF -if still elevated after fluids, will need further work up   Family Communication/Anticipated D/C date and plan/Code  Status   Code Status: Full Code.   Disposition Plan: Status is: Observation  The patient will require care spanning > 2 midnights and should be moved to inpatient because: Inpatient level of care appropriate due to severity of illness  Dispo: The patient is from: Home              Anticipated d/c is to: Home              Patient currently is not medically stable to d/c.   Difficult to place patient No         Medical Consultants:   GI   Subjective:   Still vomiting  Objective:    Vitals:   09/02/20 0704 09/02/20 0737 09/02/20 0800 09/02/20 0900  BP: 126/77 134/80 132/82 130/77  Pulse: (!) 115 (!) 135 84 (!) 107  Resp: 16 (!) 24 20 19   Temp: 98.8 F (37.1 C) 98.6 F (37 C)  98.5 F (36.9 C)  TempSrc: Oral Oral  Oral  SpO2: 94% 96% 97% 96%  Weight:      Height:        Intake/Output Summary (Last 24 hours) at 09/02/2020 1033 Last data filed at 09/02/2020 3267 Gross per 24 hour  Intake 1478.71 ml  Output --  Net 1478.71 ml   Filed Weights   08/22/2020 1400 08/17/2020 2100  Weight: 73.5 kg 73.5 kg    Exam:  General: Appearance:    Ill appearing male with vomit on his chest     Lungs:     respirations unlabored  Heart:    Tachycardic. Irregularly  irregular rhythm. No murmurs, rubs, or gallops.   MS:   All extremities are intact.    Neurologic:   Awake, alert     Data Reviewed:   I have personally reviewed following labs and imaging studies:  Labs: Labs show the following:   Basic Metabolic Panel: Recent Labs  Lab 08/29/2020 1402 08/18/2020 1405 09/10/2020 2123 09/02/20 0448  NA 133*  --  131* 128*  K 2.2*  --  2.5* 3.2*  CL 83*  --  83* 82*  CO2 35*  --  35* 34*  GLUCOSE 111*  --  116* 112*  BUN 27*  --  25* 29*  CREATININE 0.97  --  0.92 0.90  CALCIUM 10.6*  --  10.6* 10.4*  MG  --  1.5*  --  2.4   GFR Estimated Creatinine Clearance: 49.6 mL/min (by C-G formula based on SCr of 0.9 mg/dL). Liver Function Tests: Recent Labs  Lab  08/28/2020 1405  AST 42*  ALT 22  ALKPHOS 94  BILITOT 2.7*  PROT 7.1  ALBUMIN 3.6   Recent Labs  Lab 09/14/2020 1405  LIPASE 39   No results for input(s): AMMONIA in the last 168 hours. Coagulation profile No results for input(s): INR, PROTIME in the last 168 hours.  CBC: Recent Labs  Lab 09/04/2020 1402 09/02/20 0448  WBC 9.7 8.2  HGB 14.2 13.9  HCT 39.8 40.1  MCV 92.8 93.5  PLT 111* 99*   Cardiac Enzymes: No results for input(s): CKTOTAL, CKMB, CKMBINDEX, TROPONINI in the last 168 hours. BNP (last 3 results) No results for input(s): PROBNP in the last 8760 hours. CBG: No results for input(s): GLUCAP in the last 168 hours. D-Dimer: Recent Labs    09/06/2020 1405  DDIMER 3.22*   Hgb A1c: No results for input(s): HGBA1C in the last 72 hours. Lipid Profile: No results for input(s): CHOL, HDL, LDLCALC, TRIG, CHOLHDL, LDLDIRECT in the last 72 hours. Thyroid function studies: No results for input(s): TSH, T4TOTAL, T3FREE, THYROIDAB in the last 72 hours.  Invalid input(s): FREET3 Anemia work up: No results for input(s): VITAMINB12, FOLATE, FERRITIN, TIBC, IRON, RETICCTPCT in the last 72 hours. Sepsis Labs: Recent Labs  Lab 08/17/2020 1402 08/27/2020 1456 09/03/2020 1626 09/02/20 0448  WBC 9.7  --   --  8.2  LATICACIDVEN  --  2.8* 2.5*  --     Microbiology Recent Results (from the past 240 hour(s))  Resp Panel by RT-PCR (Flu A&B, Covid) Nasopharyngeal Swab     Status: None   Collection Time: 08/26/2020  2:56 PM   Specimen: Nasopharyngeal Swab; Nasopharyngeal(NP) swabs in vial transport medium  Result Value Ref Range Status   SARS Coronavirus 2 by RT PCR NEGATIVE NEGATIVE Final    Comment: (NOTE) SARS-CoV-2 target nucleic acids are NOT DETECTED.  The SARS-CoV-2 RNA is generally detectable in upper respiratory specimens during the acute phase of infection. The lowest concentration of SARS-CoV-2 viral copies this assay can detect is 138 copies/mL. A negative result  does not preclude SARS-Cov-2 infection and should not be used as the sole basis for treatment or other patient management decisions. A negative result may occur with  improper specimen collection/handling, submission of specimen other than nasopharyngeal swab, presence of viral mutation(s) within the areas targeted by this assay, and inadequate number of viral copies(<138 copies/mL). A negative result must be combined with clinical observations, patient history, and epidemiological information. The expected result is Negative.  Fact Sheet for Patients:  EntrepreneurPulse.com.au  Fact Sheet  for Healthcare Providers:  IncredibleEmployment.be  This test is no t yet approved or cleared by the [place] and  has been authorized for detection and/or diagnosis of SARS-CoV-2 by FDA under an Emergency Use Authorization (EUA). This EUA will remain  in effect (meaning this test can be used) for the duration of the COVID-19 declaration under Section 564(b)(1) of the Act, 21 U.S.C.section 360bbb-3(b)(1), unless the authorization is terminated  or revoked sooner.       Influenza A by PCR NEGATIVE NEGATIVE Final   Influenza B by PCR NEGATIVE NEGATIVE Final    Comment: (NOTE) The Xpert Xpress SARS-CoV-2/FLU/RSV plus assay is intended as an aid in the diagnosis of influenza from Nasopharyngeal swab specimens and should not be used as a sole basis for treatment. Nasal washings and aspirates are unacceptable for Xpert Xpress SARS-CoV-2/FLU/RSV testing.  Fact Sheet for Patients: EntrepreneurPulse.com.au  Fact Sheet for Healthcare Providers: IncredibleEmployment.be  This test is not yet approved or cleared by the [place] FDA and has been authorized for detection and/or diagnosis of SARS-CoV-2 by FDA under an Emergency Use Authorization (EUA). This EUA will remain in effect (meaning this test can be used) for  the duration of the COVID-19 declaration under Section 564(b)(1) of the Act, 21 U.S.C. section 360bbb-3(b)(1), unless the authorization is terminated or revoked.  Performed at [facility], [address] [place], [place] [address]   Blood culture (routine x 2)     Status: None (Preliminary result)   Collection Time: 09/14/2020  4:13 PM   Specimen: BLOOD  Result Value Ref Range Status   Specimen Description BLOOD RIGHT HAND  Final   Special Requests   Final    BOTTLES DRAWN AEROBIC AND ANAEROBIC Blood Culture results may not be optimal due to an inadequate volume of blood received in culture bottles   Culture   Final    NO GROWTH < 24 HOURS Performed at [facility], [address] [place], [place] [address]    Report Status PENDING  Incomplete  Blood culture (routine x 2)     Status: None (Preliminary result)   Collection Time: 08/23/2020  4:13 PM   Specimen: BLOOD  Result Value Ref Range Status   Specimen Description BLOOD  RAC  Final   Special Requests   Final    BOTTLES DRAWN AEROBIC AND ANAEROBIC Blood Culture results may not be optimal due to an excessive volume of blood received in culture bottles   Culture   Final    NO GROWTH < 24 HOURS Performed at [facility], [address], [place], [place] [address]    Report Status PENDING  Incomplete    Procedures and diagnostic studies:  DG Chest 2 View  Result Date: 08/21/2020 CLINICAL DATA:  Left chest pain. EXAM: CHEST - 2 VIEW COMPARISON:  06/02/2019 FINDINGS: Cardiac enlargement. Normal vascularity. Atherosclerotic calcification aorta Patchy infiltrate in the right mid and lower lung zone, not present previously. Suspicious for pneumonia. No significant pleural effusion. IMPRESSION: Right lower lobe infiltrate, probable pneumonia Electronically Signed   By: Franchot Gallo M.D.   On: 08/21/2020 14:56   CT Angio Chest PE W and/or Wo Contrast  Result Date: 09/07/2020 CLINICAL DATA:  The  left-sided chest pain beginning last night. Vomiting. EXAM: CT ANGIOGRAPHY CHEST WITH CONTRAST TECHNIQUE: Multidetector CT imaging of the chest was performed using the standard protocol during bolus administration of intravenous contrast. Multiplanar CT image reconstructions and MIPs were obtained to evaluate the vascular  anatomy. CONTRAST:  15mL OMNIPAQUE IOHEXOL 350 MG/ML SOLN COMPARISON:  CT chest x-ray a 08/18/2020 CT a chest 07/16/2018 FINDINGS: Cardiovascular: Heart is mildly enlarged, stable. Coronary artery calcifications again noted. Right atrium enlarged. Atherosclerotic calcifications are present the aortic arch and great vessel origins without aneurysm or stenosis. Pulmonary artery opacification is excellent. No significant filling defects are present. Pulmonary artery size is normal. Mediastinum/Nodes: No significant mediastinal, hilar, or axillary adenopathy is present. Esophagus is somewhat dilated and fluid-filled. Large distal hiatal hernia is again noted, fluid-filled. Lungs/Pleura: Patchy airspace opacities are present throughout the right lung. Left lung is clear. Airways are patent. No significant pleural disease is present. Upper Abdomen: Upper abdomen is unremarkable. Musculoskeletal: Exaggerated thoracic kyphosis is again seen. Vertebral body heights maintained. Alignment is anatomic. Review of the MIP images confirms the above findings. IMPRESSION: 1. No pulmonary embolus. 2. Patchy airspace opacities throughout the right lung compatible with multi lobar pneumonia. 3. Stable cardiomegaly without failure. 4. Coronary artery disease. 5. Large distal hiatal hernia, fluid-filled. 6. Exaggerated thoracic kyphosis. 7. Aortic Atherosclerosis (ICD10-I70.0). Electronically Signed   By: San Morelle M.D.   On: 08/23/2020 16:16   DG Abd Portable 1V  Result Date: 09/02/2020 CLINICAL DATA:  87 year old male query ileus. EXAM: PORTABLE ABDOMEN - 1 VIEW COMPARISON:  CT Abdomen and Pelvis  07/16/2018. FINDINGS: Portable AP supine view at 0924 hours. Non obstructed bowel gas pattern. Similar gas pattern to the 2020 CT. Excreted IV contrast in the urinary bladder. No acute osseous abnormality identified. IMPRESSION: Normal bowel gas pattern. Electronically Signed   By: Genevie Ann M.D.   On: 09/02/2020 09:52   US ABDOMEN LIMITED RUQ (LIVER/GB)  Result Date: 08/21/2020 CLINICAL DATA:  Elevated bilirubin EXAM: ULTRASOUND ABDOMEN LIMITED RIGHT UPPER QUADRANT COMPARISON:  07/16/2018 FINDINGS: Gallbladder: No gallstones or wall thickening visualized. No sonographic Murphy sign noted by sonographer. Common bile duct: Diameter: 3.9 mm. Liver: Increased echogenicity with heterogeneity consistent with fatty infiltration. Portal vein is patent on color Doppler imaging with normal direction of blood flow towards the liver. Other: Stomach is distended with fluid. IMPRESSION: Fatty liver.  No acute abnormality noted. Electronically Signed   By: Inez Catalina M.D.   On: 08/18/2020 17:25    Medications:    aspirin EC  81 mg Oral Daily   atorvastatin  40 mg Oral QHS   clopidogrel  75 mg Oral Daily   enoxaparin (LOVENOX) injection  40 mg Subcutaneous Q24H   metoprolol tartrate  5 mg Intravenous Q6H   multivitamin with minerals  1 tablet Oral Daily   pneumococcal 23 valent vaccine  0.5 mL Intramuscular Tomorrow-1000   cyanocobalamin  1,000 mcg Oral Daily   Continuous Infusions:  sodium chloride     azithromycin     cefTRIAXone (ROCEPHIN)  IV     potassium chloride 10 mEq (09/02/20 1025)     LOS: 0 days   Geradine Girt  [facility]   How to contact the [facility] Attending or Consulting provider [place] or covering provider during after hours [place], for this patient?  Check the care team in [facility] and look for a) attending/consulting [facility] provider listed and b) the [facility] team listed Log into [email or web address] and use [place]'s universal password to access. If you do not have the password, please  contact the hospital operator. Locate the [facility] provider you are looking for under [facility] and page to a number that you can be directly reached. If you still have difficulty  reaching the provider, please page the [facility] (Director on Call) for the Hospitalists listed on amion for assistance.  09/02/2020, 10:33 AM

## 2020-09-02 NOTE — Consult Note (Addendum)
Dustin Darby, MD [address]  [place]  [place], [place] [address]  Main: [phone]  Fax: [phone] Pager: [phone]   Consultation  Referring Provider:     No ref. provider found Primary Care Physician:  Baxter Hire, MD Primary Gastroenterologist: Althia Forts         Reason for Consultation:     Coffee-ground emesis, nausea and vomiting  Date of Admission:  08/16/2020 Date of Consultation:  09/02/2020         HPI:   Dustin Acosta. is a 87 y.o. male with history of A. fib, coronary artery disease s/p PCI, history of stroke, hypertension who lives independently presented with 1 week history of progressively worsening generalized weakness associated with nausea, vomiting and chest pain, subjective fevers.  Patient is diagnosed with multilobar pneumonia of the right lung based on the CT chest.  He is also found to have large hiatal hernia filled with fluid as well as fluid-filled esophagus that was somewhat dilated.  Patiently is also found to have dried coffee-ground material around his mouth.  Patient is currently being treated for pneumonia, currently maintaining well on 2 L oxygen.  He is not on oxygen at baseline.  When I interviewed the patient, he was lethargic, his daughter was bedside who provided most of the history.  Patient also underwent x-ray abdomen with no evidence of bowel obstruction.  Patient refused NG tube placement at bedside for decompression.  Patient's daughter reported that patient lives independently, able to manage simple ADLs and his daughter checks on him every day who lives next door  NSAIDs: None  Antiplts/Anticoagulants/Anti thrombotics: Aspirin and Plavix  GI Procedures: Patient's daughter reported that he had an upper endoscopy by Dr. Vira Agar several years ago and he was informed about the hiatal hernia and his esophagus was stretched  Past Medical History:  Diagnosis Date   A-fib ([place])    Cancer ([place])    skin / COLON    Coronary artery disease    Dysrhythmia    Edema    FEET/LEGS   Heart murmur    Hypertension    Myocardial infarction [facility])    Stroke ([place])    X 2    Past Surgical History:  Procedure Laterality Date   CARDIAC CATHETERIZATION N/A 08/06/2014   Procedure: Left Heart Cath and Coronary Angiography;  Surgeon: Charolette Forward, MD;  Location: [facility];  Service: Cardiovascular;  Laterality: N/A;   CATARACT EXTRACTION W/PHACO Left 04/21/2018   Procedure: CATARACT EXTRACTION PHACO AND INTRAOCULAR LENS PLACEMENT ([place]) LEFT;  Surgeon: Birder Robson, MD;  Location: [facility] ORS;  Service: Ophthalmology;  Laterality: Left;  [place] 01:50.9 CDE 17.73 Fluid Pack Lot # [id number]   COLON SURGERY     CORONARY ANGIOPLASTY      Prior to Admission medications   Medication Sig Start Date End Date Taking? Authorizing Provider  aspirin EC 81 MG tablet Take 81 mg by mouth daily.   Yes [provider]  cyanocobalamin 1000 MCG tablet Take 1,000 mcg by mouth daily. 09/03/17  Yes [provider]  furosemide (LASIX) 40 MG tablet Take 80 mg by mouth daily. with food 03/09/19  Yes [provider]  metoprolol succinate (TOPROL-XL) 25 MG 24 hr tablet Take 25 mg by mouth daily. 05/30/20  Yes [provider]  Multiple Vitamin (MULTIVITAMIN) tablet Take 1 tablet by mouth daily.   Yes [provider]  potassium chloride SA (K-DUR) 20 MEQ tablet Take  20 mEq by mouth daily. 05/26/18 09/02/20 Yes [provider]  EPINEPHrine 0.3 mg/0.3 mL IJ SOAJ injection Inject 0.3 mLs (0.3 mg total) into the muscle once. Follow package instructions as needed for severe allergy or anaphylactic reaction. 03/28/15   Carrie Mew, MD  nitroGLYCERIN (NITROSTAT) 0.4 MG SL tablet Place 1 tablet (0.4 mg total) under the tongue every 5 (five) minutes x 3 doses as needed for chest pain. 08/09/14   Charolette Forward, MD    Current Facility-Administered Medications:    0.9 %  sodium chloride  infusion, , Intravenous, Continuous, Geradine Girt, DO, Last Rate: 75 mL/hr at 09/02/20 1047, New Bag at 09/02/20 1047   acetaminophen (TYLENOL) tablet 650 mg, 650 mg, Oral, Q6H PRN **OR** acetaminophen (TYLENOL) suppository 650 mg, 650 mg, Rectal, Q6H PRN, Cox, Amy N, DO   azithromycin (ZITHROMAX) 500 mg in sodium chloride 0.9 % 250 mL IVPB, 500 mg, Intravenous, Q24H, Cox, Amy N, DO, Last Rate: 250 mL/hr at 09/02/20 1242, 500 mg at 09/02/20 1242   cefTRIAXone (ROCEPHIN) 1 g in sodium chloride 0.9 % 100 mL IVPB, 1 g, Intravenous, Q24H, Cox, Amy N, DO, Last Rate: 200 mL/hr at 09/02/20 1142, 1 g at 09/02/20 1142   enoxaparin (LOVENOX) injection 40 mg, 40 mg, Subcutaneous, Q24H, Cox, Amy N, DO, 40 mg at 08/28/2020 2125   melatonin tablet 5 mg, 5 mg, Oral, QHS PRN, Cox, Amy N, DO, 5 mg at 09/08/2020 2125   metoCLOPramide (REGLAN) injection 5 mg, 5 mg, Intravenous, Once, Vanga, Tally Due, MD   metoprolol tartrate (LOPRESSOR) injection 5 mg, 5 mg, Intravenous, Q6H, Vann, Jessica U, DO, 5 mg at 09/02/20 1238   multivitamin with minerals tablet 1 tablet, 1 tablet, Oral, Daily, Cox, Amy N, DO   nitroGLYCERIN (NITROSTAT) SL tablet 0.4 mg, 0.4 mg, Sublingual, Q5 Min x 3 PRN, Cox, Amy N, DO   ondansetron (ZOFRAN) tablet 4 mg, 4 mg, Oral, Q6H PRN **OR** ondansetron (ZOFRAN) injection 4 mg, 4 mg, Intravenous, Q6H PRN, Cox, Amy N, DO, 4 mg at 09/02/20 0804   pantoprazole (PROTONIX) 80 mg /NS 100 mL IVPB, 80 mg, Intravenous, Once, Vanga, Tally Due, MD   Derrill Memo ON 09/06/2020] pantoprazole (PROTONIX) injection 40 mg, 40 mg, Intravenous, Q12H, Vanga, Tally Due, MD   pantoprozole (PROTONIX) 80 mg /NS 100 mL infusion, 8 mg/hr, Intravenous, Continuous, Vanga, Tally Due, MD   pneumococcal 23 valent vaccine (PNEUMOVAX-23) injection 0.5 mL, 0.5 mL, Intramuscular, Tomorrow-1000, Cox, Amy N, DO   prochlorperazine (COMPAZINE) injection 5 mg, 5 mg, Intravenous, Q6H PRN, Vann, Jessica U, DO, 5 mg at 09/02/20 1138    vitamin B-12 (CYANOCOBALAMIN) tablet 1,000 mcg, 1,000 mcg, Oral, Daily, Cox, Amy N, DO  No family history on file.   Social History   Tobacco Use   Smoking status: Never   Smokeless tobacco: Never  Substance Use Topics   Alcohol use: No   Drug use: No    Allergies as of 08/30/2020 - Review Complete 09/11/2020  Allergen Reaction Noted   Other Rash and Shortness Of Breath 02/05/2019   Apixaban Itching and Rash 02/05/2019   Warfarin Itching and Rash 02/05/2019    Review of Systems:    All systems reviewed and negative except where noted in HPI.   Physical Exam:  Vital signs in last 24 hours: Temp:  [97.4 F (36.3 C)-98.8 F (37.1 C)] 98.8 F (37.1 C) (06/18 1100) Pulse Rate:  [80-135] 100 (06/18 1100) Resp:  [16-24] 19 (06/18 1100) BP: (102-142)/(53-88)  132/80 (06/18 1100) SpO2:  [89 %-97 %] 97 % (06/18 1100) Weight:  [73.5 kg] 73.5 kg (06/17 2100) Last BM Date: 08/31/20 General: Lethargic, NAD, closes eyes during mental conversation with him Head:  Normocephalic and atraumatic. Eyes:   No icterus.   Conjunctiva pink. PERRLA. Ears:  Normal auditory acuity. Neck:  Supple; no masses or thyroidomegaly Lungs: Respirations even and unlabored. Lungs clear to auscultation bilaterally.   No wheezes, crackles, or rhonchi.  Heart:  Regular rate and rhythm;  Without murmur, clicks, rubs or gallops Abdomen:  Soft, nondistended, nontender. Normal bowel sounds. No appreciable masses or hepatomegaly.  No rebound or guarding.  Rectal:  Not performed. Msk:  Symmetrical without gross deformities.  Strength generalized weakness Extremities:  Without edema, cyanosis or clubbing. Neurologic: Unable to assess Skin:  Intact without significant lesions or rashes.  LAB RESULTS: CBC Latest Ref Rng & Units 09/02/2020 08/23/2020 06/02/2019  WBC 4.0 - 10.5 K/uL 8.2 9.7 5.7  Hemoglobin 13.0 - 17.0 g/dL 13.9 14.2 12.9(L)  Hematocrit 39.0 - 52.0 % 40.1 39.8 38.7(L)  Platelets 150 - 400 K/uL 99(L)  111(L) 121(L)    BMET BMP Latest Ref Rng & Units 09/02/2020 09/06/2020 08/25/2020  Glucose 70 - 99 mg/dL 112(H) 116(H) 111(H)  BUN 8 - 23 mg/dL 29(H) 25(H) 27(H)  Creatinine 0.61 - 1.24 mg/dL 0.90 0.92 0.97  Sodium 135 - 145 mmol/L 128(L) 131(L) 133(L)  Potassium 3.5 - 5.1 mmol/L 3.2(L) 2.5(LL) 2.2(LL)  Chloride 98 - 111 mmol/L 82(L) 83(L) 83(L)  CO2 22 - 32 mmol/L 34(H) 35(H) 35(H)  Calcium 8.9 - 10.3 mg/dL 10.4(H) 10.6(H) 10.6(H)    LFT Hepatic Function Latest Ref Rng & Units 08/25/2020 07/16/2018 08/06/2014  Total Protein 6.5 - 8.1 g/dL 7.1 7.6 6.3(L)  Albumin 3.5 - 5.0 g/dL 3.6 4.0 3.5  AST 15 - 41 U/L 42(H) 34 64(H)  ALT 0 - 44 U/L _0 Alk Phosphatase 38 - 126 U/L 94 171(H) 62  Total Bilirubin 0.3 - 1.2 mg/dL 2.7(H) 1.8(H) 0.8  Bilirubin, Direct 0.0 - 0.2 mg/dL 0.8(H) - -     STUDIES: DG Chest 2 View  Result Date: 08/16/2020 CLINICAL DATA:  Left chest pain. EXAM: CHEST - 2 VIEW COMPARISON:  06/02/2019 FINDINGS: Cardiac enlargement. Normal vascularity. Atherosclerotic calcification aorta Patchy infiltrate in the right mid and lower lung zone, not present previously. Suspicious for pneumonia. No significant pleural effusion. IMPRESSION: Right lower lobe infiltrate, probable pneumonia Electronically Signed   By: Franchot Gallo M.D.   On: 08/27/2020 14:56   CT Angio Chest PE W and/or Wo Contrast  Result Date: 08/23/2020 CLINICAL DATA:  The left-sided chest pain beginning last night. Vomiting. EXAM: CT ANGIOGRAPHY CHEST WITH CONTRAST TECHNIQUE: Multidetector CT imaging of the chest was performed using the standard protocol during bolus administration of intravenous contrast. Multiplanar CT image reconstructions and MIPs were obtained to evaluate the vascular anatomy. CONTRAST:  73m OMNIPAQUE IOHEXOL 350 MG/ML SOLN COMPARISON:  CT chest x-ray a 08/31/2020 CT a chest 07/16/2018 FINDINGS: Cardiovascular: Heart is mildly enlarged, stable. Coronary artery calcifications again noted.  Right atrium enlarged. Atherosclerotic calcifications are present the aortic arch and great vessel origins without aneurysm or stenosis. Pulmonary artery opacification is excellent. No significant filling defects are present. Pulmonary artery size is normal. Mediastinum/Nodes: No significant mediastinal, hilar, or axillary adenopathy is present. Esophagus is somewhat dilated and fluid-filled. Large distal hiatal hernia is again noted, fluid-filled. Lungs/Pleura: Patchy airspace opacities are present throughout the right lung. Left lung  is clear. Airways are patent. No significant pleural disease is present. Upper Abdomen: Upper abdomen is unremarkable. Musculoskeletal: Exaggerated thoracic kyphosis is again seen. Vertebral body heights maintained. Alignment is anatomic. Review of the MIP images confirms the above findings. IMPRESSION: 1. No pulmonary embolus. 2. Patchy airspace opacities throughout the right lung compatible with multi lobar pneumonia. 3. Stable cardiomegaly without failure. 4. Coronary artery disease. 5. Large distal hiatal hernia, fluid-filled. 6. Exaggerated thoracic kyphosis. 7. Aortic Atherosclerosis (ICD10-I70.0). Electronically Signed   By: San Morelle M.D.   On: 09/11/2020 16:16   DG Abd Portable 1V  Result Date: 09/02/2020 CLINICAL DATA:  87 year old male query ileus. EXAM: PORTABLE ABDOMEN - 1 VIEW COMPARISON:  CT Abdomen and Pelvis 07/16/2018. FINDINGS: Portable AP supine view at 0924 hours. Non obstructed bowel gas pattern. Similar gas pattern to the 2020 CT. Excreted IV contrast in the urinary bladder. No acute osseous abnormality identified. IMPRESSION: Normal bowel gas pattern. Electronically Signed   By: Genevie Ann M.D.   On: 09/02/2020 09:52   US ABDOMEN LIMITED RUQ (LIVER/GB)  Result Date: 08/16/2020 CLINICAL DATA:  Elevated bilirubin EXAM: ULTRASOUND ABDOMEN LIMITED RIGHT UPPER QUADRANT COMPARISON:  07/16/2018 FINDINGS: Gallbladder: No gallstones or wall thickening  visualized. No sonographic Murphy sign noted by sonographer. Common bile duct: Diameter: 3.9 mm. Liver: Increased echogenicity with heterogeneity consistent with fatty infiltration. Portal vein is patent on color Doppler imaging with normal direction of blood flow towards the liver. Other: Stomach is distended with fluid. IMPRESSION: Fatty liver.  No acute abnormality noted. Electronically Signed   By: Inez Catalina M.D.   On: 09/11/2020 17:25      Impression / Plan:   Dustin Acosta. is a 87 y.o. male with history of A. fib, coronary disease s/p PCI on aspirin and Plavix, known history of hiatal hernia presented with 1 week history of generalized weakness, nausea, vomiting and chest pain, with subjective fevers found to have right multilobar pneumonia  Nausea, vomiting along with coffee-ground emesis: Elevated BUN/creatinine ratio suggestive of upper GI source With known history of hiatal hernia and possible esophageal stricture, differentials include Lysbeth Galas erosions/ulcers, erosive esophagitis, peptic stricture, esophageal ulcers.  Of note patient is not on PPI as outpatient Recommend pantoprazole drip Trial of Reglan 5 mg IV x1 for gastric emptying IV fluids in order to correct uremia Patient refused bedside NG tube placement, can have IR attempt NG tube placement with gastric decompression Defer upper endoscopy at this time as patient is currently being treated for multilobar pneumonia and is on oxygen, I have personally discussed the case with anesthesiologist on-call.  Upper endoscopy can be performed next week once patient is able to be weaned off oxygen Continue to hold Plavix Monitor CBC daily, currently not anemic  Hyperbilirubinemia, predominantly indirect bilirubin most likely stress-induced Ultrasound abdomen revealed normal CBD, no evidence of cholelithiasis or choledocholithiasis, there was fatty liver  I have discussed my recommendations with patient's daughter who is in  agreement with the plan  Thank you for involving me in the care of this patient.  GI will follow along with you    LOS: 0 days   Sherri Sear, MD  09/02/2020, 2:57 PM    Note: This dictation was prepared with Dragon dictation along with smaller phrase technology. Any transcriptional errors that result from this process are unintentional.

## 2020-09-02 NOTE — Plan of Care (Signed)
End of Shift Summary:  Persistent n/v - see progress notes. HR 90s-140s. Other VSS. Placed on 2L via [place] due to desats to 86% on room air during prolonged coughing spells. Pt endorses general weakness, more so than yesterday. Potassium replaced PO and IV, up to 3.2 from 2.5. Remained free from falls or injury. Bed low and in locked position. Call bell within reach and able to use.   Problem: Education: Goal: Knowledge of General Education information will improve Description: Including pain rating scale, medication(s)/side effects and non-pharmacologic comfort measures Outcome: Progressing   Problem: Health Behavior/Discharge Planning: Goal: Ability to manage health-related needs will improve Outcome: Progressing   Problem: Clinical Measurements: Goal: Ability to maintain clinical measurements within normal limits will improve Outcome: Progressing Goal: Will remain free from infection Outcome: Progressing Goal: Diagnostic test results will improve Outcome: Progressing Goal: Respiratory complications will improve Outcome: Progressing Goal: Cardiovascular complication will be avoided Outcome: Progressing   Problem: Activity: Goal: Risk for activity intolerance will decrease Outcome: Progressing   Problem: Nutrition: Goal: Adequate nutrition will be maintained Outcome: Progressing   Problem: Coping: Goal: Level of anxiety will decrease Outcome: Progressing   Problem: Pain Managment: Goal: General experience of comfort will improve Outcome: Progressing   Problem: Safety: Goal: Ability to remain free from injury will improve Outcome: Progressing   Problem: Skin Integrity: Goal: Risk for impaired skin integrity will decrease Outcome: Progressing

## 2020-09-02 NOTE — Progress Notes (Addendum)
   09/02/20 0416  Assess: MEWS Score  Temp 98.8 F (37.1 C)  BP 119/63  Pulse Rate (!) 120  Resp 20  Level of Consciousness Alert  SpO2 93 %  O2 Device Nasal Cannula  O2 Flow Rate (L/min) 2 L/min  Assess: MEWS Score  MEWS Temp 0  MEWS Systolic 0  MEWS Pulse 2  MEWS RR 0  MEWS LOC 0  MEWS Score 2  MEWS Score Color Yellow    Patient's persistent wet cough has worsened, 86% on room air, up to 93% on 2L [place], HR sustaining in 120s-140s while coughing. Pt states "I just don't feel well." Denies pain. Provider notified of above, see orders.

## 2020-09-02 NOTE — Progress Notes (Signed)
Initial Nutrition Assessment  DOCUMENTATION CODES:   Not applicable  INTERVENTION:   RD will monitor for diet advancement vs the need for nutrition support.   Pt at high refeed risk; recommend monitor potassium, magnesium and phosphorus labs daily until stable  NUTRITION DIAGNOSIS:   Inadequate oral intake related to acute illness as evidenced by per patient/family report.  GOAL:   Patient will meet greater than or equal to 90% of their needs  MONITOR:   PO intake, Supplement acceptance, Labs, Weight trends, Skin, I & O's  REASON FOR ASSESSMENT:   Malnutrition Screening Tool    ASSESSMENT:   87 y.o. male with medical history significant for chronic atrial fibrillation, pulmonary hypertension, hypertension, hyperlipidemia, stroke, gout, colon cancer status post partial colectomy in 2010, CHD and CAD status post PCI, last left heart cath was on 08/06/2014 with DES to mid RCA who is admitted with PNA and nausea/vomiting x 1 week  RD working remotely.  Pt with poor appetite and oral intake for the past week r/t nausea and vomiting. Pt continues to have nausea and vomiting in hospital. CT scan reporting large hiatal hernia and fluid filled esophagus. Pt currently NPO. RD will monitor for diet advancement vs the need for nutrition support. Pt is likely at high refeed risk. GI consult pending. Pt may require TPN. Per chart, pt appears weight stable at baseline.   Medications reviewed and include: lovenox, MVI, B12, NaCl @75ml /hr, azithromycin, ceftriaxone   Labs reviewed: Na 128(L), K 3.2(L), BUN 29(H), Ca 10.4(H), Mg 2.4 wnl  NUTRITION - FOCUSED PHYSICAL EXAM: Unable to perform at this time   Diet Order:   Diet Order             Diet NPO time specified Except for: Ice Chips, Sips with Meds  Diet effective now                  EDUCATION NEEDS:   No education needs have been identified at this time  Skin:  Skin Assessment: Reviewed RN Assessment  Last BM:   6/16  Height:   Ht Readings from Last 1 Encounters:  08/30/2020 5\' 8"  (1.727 m)    Weight:   Wt Readings from Last 1 Encounters:  08/31/2020 73.5 kg    Ideal Body Weight:  70 kg  BMI:  Body mass index is 24.64 kg/m.  Estimated Nutritional Needs:   Kcal:  1700-1900kcal/day  Protein:  85-95g/day  Fluid:  1.7-1.9L/day  Koleen Distance MS, RD, LDN Please refer to [facility] for RD and/or RD on-call/weekend/after hours pager

## 2020-09-02 NOTE — Progress Notes (Signed)
   09/02/20 0737  Assess: MEWS Score  Temp 98.6 F (37 C)  BP 134/80  Pulse Rate (!) 135  Resp (!) 24  Level of Consciousness Alert  SpO2 96 %   Patient MEWS flagged red. Patient had been [place]. Discussed with Charge nurse Caryl Pina, MD [facility] notified, and ICU nurse called to verify if RRT needed. Vital signs were reassessed at 0800 changing MEWS to [place]. Scheduled meds were given. BP 132/80, HR 100, RR 20. At 0900 medications reassessment BP 130/77, HR 107, RR 19

## 2020-09-03 DIAGNOSIS — I4891 Unspecified atrial fibrillation: Secondary | ICD-10-CM

## 2020-09-03 DIAGNOSIS — K920 Hematemesis: Secondary | ICD-10-CM | POA: Diagnosis not present

## 2020-09-03 DIAGNOSIS — R112 Nausea with vomiting, unspecified: Secondary | ICD-10-CM | POA: Diagnosis not present

## 2020-09-03 DIAGNOSIS — E876 Hypokalemia: Secondary | ICD-10-CM | POA: Diagnosis not present

## 2020-09-03 DIAGNOSIS — J189 Pneumonia, unspecified organism: Secondary | ICD-10-CM | POA: Diagnosis not present

## 2020-09-03 LAB — CBC
HCT: 40.8 % (ref 39.0–52.0)
Hemoglobin: 14 g/dL (ref 13.0–17.0)
MCH: 32.9 pg (ref 26.0–34.0)
MCHC: 34.3 g/dL (ref 30.0–36.0)
MCV: 95.8 fL (ref 80.0–100.0)
Platelets: 80 10*3/uL — ABNORMAL LOW (ref 150–400)
RBC: 4.26 MIL/uL (ref 4.22–5.81)
RDW: 14.7 % (ref 11.5–15.5)
WBC: 4.2 10*3/uL (ref 4.0–10.5)
nRBC: 0 % (ref 0.0–0.2)

## 2020-09-03 LAB — COMPREHENSIVE METABOLIC PANEL
ALT: 16 U/L (ref 0–44)
AST: 28 U/L (ref 15–41)
Albumin: 2.8 g/dL — ABNORMAL LOW (ref 3.5–5.0)
Alkaline Phosphatase: 55 U/L (ref 38–126)
Anion gap: 8 (ref 5–15)
BUN: 31 mg/dL — ABNORMAL HIGH (ref 8–23)
CO2: 31 mmol/L (ref 22–32)
Calcium: 10.2 mg/dL (ref 8.9–10.3)
Chloride: 95 mmol/L — ABNORMAL LOW (ref 98–111)
Creatinine, Ser: 0.84 mg/dL (ref 0.61–1.24)
GFR, Estimated: >60 mL/min (ref >60)
Glucose, Bld: 83 mg/dL (ref 70–99)
Potassium: 3.4 mmol/L — ABNORMAL LOW (ref 3.5–5.1)
Sodium: 134 mmol/L — ABNORMAL LOW (ref 135–145)
Total Bilirubin: 2.2 mg/dL — ABNORMAL HIGH (ref 0.3–1.2)
Total Protein: 5.8 g/dL — ABNORMAL LOW (ref 6.5–8.1)

## 2020-09-03 MED ORDER — METOPROLOL TARTRATE 5 MG/5ML IV SOLN
2.5000 mg | Freq: Four times a day (QID) | INTRAVENOUS | Status: DC
  Administered 2020-09-03 – 2020-09-05 (×8): 2.5 mg via INTRAVENOUS
  Filled 2020-09-03 (×7): qty 5

## 2020-09-03 MED ORDER — POTASSIUM CHLORIDE 10 MEQ/100ML IV SOLN
10.0000 meq | INTRAVENOUS | Status: AC
  Administered 2020-09-03 (×2): 10 meq via INTRAVENOUS
  Filled 2020-09-03: qty 100

## 2020-09-03 NOTE — Progress Notes (Signed)
Met with 2 of patient's children including Ivin [facility]).  Has 4 children total.  Discussion regarding prognosis and patient not wanting continued care or intervention such as NG Tube.  Family agreeable to DNR status and no escalation of care.  Will continue IVF and IV abx and O2.  Plan to re-assess in AM and make further plans based on clinicals at that time. Eulogio Bear DO

## 2020-09-03 NOTE — Progress Notes (Addendum)
   09/02/20 2137  Assess: MEWS Score  Temp 98.3 F (36.8 C)  BP 113/69  Pulse Rate (!) 121  Resp 20  Level of Consciousness Alert  SpO2 91 %  O2 Device Nasal Cannula  Assess: MEWS Score  MEWS Temp 0  MEWS Systolic 0  MEWS Pulse 2  MEWS RR 0  MEWS LOC 0  MEWS Score 2  MEWS Score Color Yellow  Assess: if the MEWS score is Yellow or Red  Were vital signs taken at a resting state? Yes  Focused Assessment No change from prior assessment  Early Detection of Sepsis Score *See Row Information* Low  MEWS guidelines implemented *See Row Information* Yes    Charge nurse notified of yellow MEWS. Continuing with plan of care.

## 2020-09-03 NOTE — Plan of Care (Addendum)
End of Shift Summary:   Pt lethargic, sleeping most of shift. Alert and oriented x4. Tachycardic, desats to 80% when on room air, humidification added to [place]. Increased to 3L at end of shift, pt intermittently removing O2 and desatting. Continues to endorse nausea, no episodes of vomiting. Protonix gtt and IVF maintained. UOP adequate. Denies pain. Remained free from falls or injury. Bed low and locked position. Call bell within reach.  Problem: Safety: Goal: Ability to remain free from injury will improve Outcome: Progressing   Problem: Elimination: Goal: Will not experience complications related to bowel motility Outcome: Progressing Goal: Will not experience complications related to urinary retention Outcome: Progressing   Problem: Coping: Goal: Level of anxiety will decrease Outcome: Progressing   Problem: Nutrition: Goal: Adequate nutrition will be maintained Outcome: Progressing   Problem: Activity: Goal: Risk for activity intolerance will decrease Outcome: Progressing   Problem: Clinical Measurements: Goal: Ability to maintain clinical measurements within normal limits will improve Outcome: Progressing Goal: Will remain free from infection Outcome: Progressing Goal: Diagnostic test results will improve Outcome: Progressing Goal: Respiratory complications will improve Outcome: Progressing Goal: Cardiovascular complication will be avoided Outcome: Progressing   Problem: Health Behavior/Discharge Planning: Goal: Ability to manage health-related needs will improve Outcome: Progressing   Problem: Education: Goal: Knowledge of General Education information will improve Description: Including pain rating scale, medication(s)/side effects and non-pharmacologic comfort measures Outcome: Progressing

## 2020-09-03 NOTE — Progress Notes (Signed)
Dustin Darby, MD [address]  [place]  [place], [place] [address]  Main: [phone]  Fax: [phone] Pager: [phone]   Subjective: Patient is in acute respiratory failure, on high flow nasal cannula.  Patient's daughter is bedside.  Continues to remain lethargic and minimally verbal   Objective: Vital signs in last 24 hours: Vitals:   09/03/20 0800 09/03/20 0805 09/03/20 0845 09/03/20 1150  BP: 101/67   109/68  Pulse: (!) 115 97  91  Resp: 20   20  Temp: 98.4 F (36.9 C)   98.9 F (37.2 C)  TempSrc:    Axillary  SpO2: (!) 79% (!) 85% 93% 95%  Weight:      Height:       Weight change:   Intake/Output Summary (Last 24 hours) at 09/03/2020 1526 Last data filed at 09/03/2020 1110 Gross per 24 hour  Intake 1786.24 ml  Output 1500 ml  Net 286.24 ml     Exam: Heart:: Regular rate and rhythm, S1S2 present, or without murmur or extra heart sounds Lungs: rales in the right lower lung Abdomen: soft, nontender, normal bowel sounds   Lab Results: CBC Latest Ref Rng & Units 09/03/2020 09/02/2020 09/14/2020  WBC 4.0 - 10.5 K/uL 4.2 8.2 9.7  Hemoglobin 13.0 - 17.0 g/dL 14.0 13.9 14.2  Hematocrit 39.0 - 52.0 % 40.8 40.1 39.8  Platelets 150 - 400 K/uL 80(L) 99(L) 111(L)   CMP Latest Ref Rng & Units 09/03/2020 09/02/2020 08/21/2020  Glucose 70 - 99 mg/dL 83 112(H) 116(H)  BUN 8 - 23 mg/dL 31(H) 29(H) 25(H)  Creatinine 0.61 - 1.24 mg/dL 0.84 0.90 0.92  Sodium 135 - 145 mmol/L 134(L) 128(L) 131(L)  Potassium 3.5 - 5.1 mmol/L 3.4(L) 3.2(L) 2.5(LL)  Chloride 98 - 111 mmol/L 95(L) 82(L) 83(L)  CO2 22 - 32 mmol/L 31 34(H) 35(H)  Calcium 8.9 - 10.3 mg/dL 10.2 10.4(H) 10.6(H)  Total Protein 6.5 - 8.1 g/dL 5.8(L) - -  Total Bilirubin 0.3 - 1.2 mg/dL 2.2(H) - -  Alkaline Phos 38 - 126 U/L 55 - -  AST 15 - 41 U/L 28 - -  ALT 0 - 44 U/L 16 - -    Micro Results: Recent Results (from the past 240 hour(s))  Resp Panel by RT-PCR (Flu A&B, Covid) Nasopharyngeal Swab      Status: None   Collection Time: 09/14/2020  2:56 PM   Specimen: Nasopharyngeal Swab; Nasopharyngeal(NP) swabs in vial transport medium  Result Value Ref Range Status   SARS Coronavirus 2 by RT PCR NEGATIVE NEGATIVE Final    Comment: (NOTE) SARS-CoV-2 target nucleic acids are NOT DETECTED.  The SARS-CoV-2 RNA is generally detectable in upper respiratory specimens during the acute phase of infection. The lowest concentration of SARS-CoV-2 viral copies this assay can detect is 138 copies/mL. A negative result does not preclude SARS-Cov-2 infection and should not be used as the sole basis for treatment or other patient management decisions. A negative result may occur with  improper specimen collection/handling, submission of specimen other than nasopharyngeal swab, presence of viral mutation(s) within the areas targeted by this assay, and inadequate number of viral copies(<138 copies/mL). A negative result must be combined with clinical observations, patient history, and epidemiological information. The expected result is Negative.  Fact Sheet for Patients:  EntrepreneurPulse.com.au  Fact Sheet for Healthcare Providers:  IncredibleEmployment.be  This test is no t yet approved or cleared by the [place] FDA and  has been authorized for detection  and/or diagnosis of SARS-CoV-2 by FDA under an Emergency Use Authorization (EUA). This EUA will remain  in effect (meaning this test can be used) for the duration of the COVID-19 declaration under Section 564(b)(1) of the Act, 21 U.S.C.section 360bbb-3(b)(1), unless the authorization is terminated  or revoked sooner.       Influenza A by PCR NEGATIVE NEGATIVE Final   Influenza B by PCR NEGATIVE NEGATIVE Final    Comment: (NOTE) The Xpert Xpress SARS-CoV-2/FLU/RSV plus assay is intended as an aid in the diagnosis of influenza from Nasopharyngeal swab specimens and should not be used as a sole basis  for treatment. Nasal washings and aspirates are unacceptable for Xpert Xpress SARS-CoV-2/FLU/RSV testing.  Fact Sheet for Patients: EntrepreneurPulse.com.au  Fact Sheet for Healthcare Providers: IncredibleEmployment.be  This test is not yet approved or cleared by the [place] FDA and has been authorized for detection and/or diagnosis of SARS-CoV-2 by FDA under an Emergency Use Authorization (EUA). This EUA will remain in effect (meaning this test can be used) for the duration of the COVID-19 declaration under Section 564(b)(1) of the Act, 21 U.S.C. section 360bbb-3(b)(1), unless the authorization is terminated or revoked.  Performed at [facility], [address] [place], [place] [address]   Blood culture (routine x 2)     Status: None (Preliminary result)   Collection Time: 09/02/2020  4:13 PM   Specimen: BLOOD  Result Value Ref Range Status   Specimen Description BLOOD RIGHT HAND  Final   Special Requests   Final    BOTTLES DRAWN AEROBIC AND ANAEROBIC Blood Culture results may not be optimal due to an inadequate volume of blood received in culture bottles   Culture   Final    NO GROWTH 2 DAYS Performed at [facility], [address], [place], [place] [address]    Report Status PENDING  Incomplete  Blood culture (routine x 2)     Status: None (Preliminary result)   Collection Time: 08/30/2020  4:13 PM   Specimen: BLOOD  Result Value Ref Range Status   Specimen Description BLOOD  RAC  Final   Special Requests   Final    BOTTLES DRAWN AEROBIC AND ANAEROBIC Blood Culture results may not be optimal due to an excessive volume of blood received in culture bottles   Culture   Final    NO GROWTH 2 DAYS Performed at [facility], [address], [place], [place] [address]    Report Status PENDING  Incomplete   Studies/Results: CT Angio Chest PE W and/or Wo Contrast  Result Date: 08/30/2020 CLINICAL DATA:   The left-sided chest pain beginning last night. Vomiting. EXAM: CT ANGIOGRAPHY CHEST WITH CONTRAST TECHNIQUE: Multidetector CT imaging of the chest was performed using the standard protocol during bolus administration of intravenous contrast. Multiplanar CT image reconstructions and MIPs were obtained to evaluate the vascular anatomy. CONTRAST:  11mL OMNIPAQUE IOHEXOL 350 MG/ML SOLN COMPARISON:  CT chest x-ray a 08/17/2020 CT a chest 07/16/2018 FINDINGS: Cardiovascular: Heart is mildly enlarged, stable. Coronary artery calcifications again noted. Right atrium enlarged. Atherosclerotic calcifications are present the aortic arch and great vessel origins without aneurysm or stenosis. Pulmonary artery opacification is excellent. No significant filling defects are present. Pulmonary artery size is normal. Mediastinum/Nodes: No significant mediastinal, hilar, or axillary adenopathy is present. Esophagus is somewhat dilated and fluid-filled. Large distal hiatal hernia is again noted, fluid-filled. Lungs/Pleura: Patchy airspace opacities are present throughout the right lung. Left lung is clear. Airways are patent.  No significant pleural disease is present. Upper Abdomen: Upper abdomen is unremarkable. Musculoskeletal: Exaggerated thoracic kyphosis is again seen. Vertebral body heights maintained. Alignment is anatomic. Review of the MIP images confirms the above findings. IMPRESSION: 1. No pulmonary embolus. 2. Patchy airspace opacities throughout the right lung compatible with multi lobar pneumonia. 3. Stable cardiomegaly without failure. 4. Coronary artery disease. 5. Large distal hiatal hernia, fluid-filled. 6. Exaggerated thoracic kyphosis. 7. Aortic Atherosclerosis (ICD10-I70.0). Electronically Signed   By: San Morelle M.D.   On: 08/31/2020 16:16   DG Abd Portable 1V  Result Date: 09/02/2020 CLINICAL DATA:  87 year old male query ileus. EXAM: PORTABLE ABDOMEN - 1 VIEW COMPARISON:  CT Abdomen and Pelvis  07/16/2018. FINDINGS: Portable AP supine view at 0924 hours. Non obstructed bowel gas pattern. Similar gas pattern to the 2020 CT. Excreted IV contrast in the urinary bladder. No acute osseous abnormality identified. IMPRESSION: Normal bowel gas pattern. Electronically Signed   By: Genevie Ann M.D.   On: 09/02/2020 09:52   US ABDOMEN LIMITED RUQ (LIVER/GB)  Result Date: 09/13/2020 CLINICAL DATA:  Elevated bilirubin EXAM: ULTRASOUND ABDOMEN LIMITED RIGHT UPPER QUADRANT COMPARISON:  07/16/2018 FINDINGS: Gallbladder: No gallstones or wall thickening visualized. No sonographic Murphy sign noted by sonographer. Common bile duct: Diameter: 3.9 mm. Liver: Increased echogenicity with heterogeneity consistent with fatty infiltration. Portal vein is patent on color Doppler imaging with normal direction of blood flow towards the liver. Other: Stomach is distended with fluid. IMPRESSION: Fatty liver.  No acute abnormality noted. Electronically Signed   By: Inez Catalina M.D.   On: 08/29/2020 17:25   Medications: I have reviewed the patient's current medications. Prior to Admission:  Medications Prior to Admission  Medication Sig Dispense Refill Last Dose   aspirin EC 81 MG tablet Take 81 mg by mouth daily.   09/12/2020   cyanocobalamin 1000 MCG tablet Take 1,000 mcg by mouth daily.   08/16/2020   furosemide (LASIX) 40 MG tablet Take 80 mg by mouth daily. with food   08/17/2020   metoprolol succinate (TOPROL-XL) 25 MG 24 hr tablet Take 25 mg by mouth daily.   09/14/2020   Multiple Vitamin (MULTIVITAMIN) tablet Take 1 tablet by mouth daily.   09/12/2020   potassium chloride SA (K-DUR) 20 MEQ tablet Take 20 mEq by mouth daily.   09/14/2020   EPINEPHrine 0.3 mg/0.3 mL IJ SOAJ injection Inject 0.3 mLs (0.3 mg total) into the muscle once. Follow package instructions as needed for severe allergy or anaphylactic reaction. 1 Device 2 prn at prn   nitroGLYCERIN (NITROSTAT) 0.4 MG SL tablet Place 1 tablet (0.4 mg total) under the  tongue every 5 (five) minutes x 3 doses as needed for chest pain. 25 tablet 12 prn at prn   Scheduled:  metoprolol tartrate  2.5 mg Intravenous Q6H   multivitamin with minerals  1 tablet Oral Daily   [START ON 09/06/2020] pantoprazole  40 mg Intravenous Q12H   pneumococcal 23 valent vaccine  0.5 mL Intramuscular Tomorrow-1000   cyanocobalamin  1,000 mcg Oral Daily   Continuous:  sodium chloride 100 mL/hr at 09/03/20 1422   azithromycin 500 mg (09/03/20 1112)   cefTRIAXone (ROCEPHIN)  IV 1 g (09/03/20 0944)   pantoprazole 8 mg/hr (09/03/20 0303)   potassium chloride 10 mEq (09/03/20 1434)   CXK:[id number] **OR** acetaminophen, melatonin, nitroGLYCERIN, ondansetron **OR** ondansetron (ZOFRAN) IV, prochlorperazine Anti-infectives (From admission, onward)    Start     Dose/Rate Route Frequency Ordered Stop   09/02/20 1000  azithromycin (ZITHROMAX) 500 mg in sodium chloride 0.9 % 250 mL IVPB        500 mg 250 mL/hr over 60 Minutes Intravenous Every 24 hours 08/25/2020 1745 2020/09/17 0959   09/02/20 1000  cefTRIAXone (ROCEPHIN) 1 g in sodium chloride 0.9 % 100 mL IVPB        1 g 200 mL/hr over 30 Minutes Intravenous Every 24 hours 08/25/2020 1745 09-17-20 0959   08/22/2020 1600  cefTRIAXone (ROCEPHIN) 1 g in sodium chloride 0.9 % 100 mL IVPB        1 g 200 mL/hr over 30 Minutes Intravenous  Once 08/18/2020 1557 08/20/2020 1753   08/25/2020 1600  azithromycin (ZITHROMAX) 500 mg in sodium chloride 0.9 % 250 mL IVPB        500 mg 250 mL/hr over 60 Minutes Intravenous  Once 09/06/2020 1557 08/23/2020 1803      Scheduled Meds:  metoprolol tartrate  2.5 mg Intravenous Q6H   multivitamin with minerals  1 tablet Oral Daily   [START ON 09/06/2020] pantoprazole  40 mg Intravenous Q12H   pneumococcal 23 valent vaccine  0.5 mL Intramuscular Tomorrow-1000   cyanocobalamin  1,000 mcg Oral Daily   Continuous Infusions:  sodium chloride 100 mL/hr at 09/03/20 1422   azithromycin 500 mg (09/03/20 1112)    cefTRIAXone (ROCEPHIN)  IV 1 g (09/03/20 0944)   pantoprazole 8 mg/hr (09/03/20 0303)   potassium chloride 10 mEq (09/03/20 1434)   PRN Meds:.acetaminophen **OR** acetaminophen, melatonin, nitroGLYCERIN, ondansetron **OR** ondansetron (ZOFRAN) IV, prochlorperazine   Assessment: Principal Problem:   PNA (pneumonia) Active Problems:   A-fib (HCC)   Bilateral edema of lower extremity   HFrEF (heart failure with reduced ejection fraction) (HCC)   Hyperlipidemia   Peripheral vascular disease (HCC)   Hypokalemia   Gastroenteritis   N&V (nausea and vomiting)   Dustin Acosta. is a 87 y.o. male with history of A. fib, coronary disease s/p PCI on aspirin and Plavix, known history of hiatal hernia presented with 1 week history of generalized weakness, nausea, vomiting and chest pain, with subjective fevers found to have right multilobar pneumonia   Plan: Nausea, vomiting along with coffee-ground emesis: Elevated BUN/creatinine ratio suggestive of upper GI source With known history of hiatal hernia and possible esophageal stricture, differentials include Cameron erosions/ulcers, erosive esophagitis, peptic stricture, esophageal ulcers.  Of note patient is not on PPI as outpatient Continue pantoprazole drip Patient is currently in acute respiratory failure, do not recommend endoscopic evaluation at this time Maintain n.p.o. status because of history of right lower lobe pneumonia most likely secondary to aspiration Patient is currently being treated for pneumonia  Agree with palliative care consult.  Patient is made DNR with no escalation of care at this time.  Overall prognosis is guarded. GI will sign off at this time, please call us back with questions or concerns   LOS: 1 day   Dustin Acosta 09/03/2020, 3:26 PM

## 2020-09-03 NOTE — Progress Notes (Signed)
Progress Note    Elizah Mierzwa [facility].  YCX:[id number] DOB: 1924-08-11  DOA: 09/07/2020 PCP: Baxter Hire, MD    Brief Narrative:     Medical records reviewed and are as summarized below:  Victorino Sparrow. is an 87 y.o. male with medical history significant for chronic atrial fibrillation, pulmonary hypertension, hypertension, hyperlipidemia, history of CAD status post PCI, last left heart cath was on 08/06/2014 with DES to mid RCA, presents to the emergency department for chief concerns of chest pain.    Assessment/Plan:   Principal Problem:   PNA (pneumonia) Active Problems:   A-fib (HCC)   Bilateral edema of lower extremity   HFrEF (heart failure with reduced ejection fraction) (HCC)   Hyperlipidemia   Peripheral vascular disease (HCC)   Hypokalemia   Gastroenteritis   N&V (nausea and vomiting)   Acute respiratory failure -escalated overnight to 6L [place] and now to high flow [place] -family considering code status change. 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 -GI consult appreciated  Multilobar pneumonia in the right lung Meets sepsis criteria with elevated heart rate, increased lactic acid, respiration rate, source of infection being pneumonia - Blood cultures x2 -Patient is status post azithromycin and ceftriaxone per ED provider - We will continue azithromycin and ceftriaxone   Hypomagnesemia/hypokalemia -replete   History of coronary artery disease status post DES to mid RCA - Left heart cath in 08/06/2014 -hold ASA/plavix until able to take PO and no need for procedure   Atrial fibrillation with RVR-not on anticoagulation due to increased risk of bleeding -Metoprolol succinate not been given due to N/V -IV BB until able to take PO    Hyperlipidemia -statin when able to take PO   Insomnia-melatonin 5 mg nightly.  For sleep  Elevated calcium- due to dehydration -IVF   Patient still refusing NG tube.  Had worsening hypoxia overnight,  discussed code status, prognosis with both daughters on phone,  also at bedside.  Will transfer to higher level of care as he is on high flow [place] now and get palliative care consult   Family Communication/Anticipated D/C date and plan/Code Status   Code Status: Full Code.   Disposition Plan: Status is: inpt  The patient will require care spanning > 2 midnights and should be moved to inpatient because: Inpatient level of care appropriate due to severity of illness  Dispo: The patient is from: Home              Anticipated d/c is to: Home              Patient currently is not medically stable to d/c.   Difficult to place patient No         Medical Consultants:   GI   Subjective:   Worsening hypoxia overnight -says he just wants to die if we won't give him water-- gave sip of water and he immediately began to vomit and cough  Objective:    Vitals:   09/03/20 0800 09/03/20 0805 09/03/20 0845 09/03/20 1150  BP: 101/67   109/68  Pulse: (!) 115 97  91  Resp: 20   20  Temp: 98.4 F (36.9 C)   98.9 F (37.2 C)  TempSrc:    Axillary  SpO2: Mar[place] Kitchen)  79% (!) 85% 93% 95%  Weight:      Height:        Intake/Output Summary (Last 24 hours) at 09/03/2020 1209 Last data filed at 09/03/2020 1110 Gross per 24 hour  Intake 1786.24 ml  Output 1500 ml  Net 286.24 ml   Filed Weights   09/13/2020 1400 08/16/2020 2100  Weight: 73.5 kg 73.5 kg    Exam:  General: Appearance:    Frail/elderly male in mild respiratory distress     Lungs:     On HFNC, no wheezing  Heart:    Normal heart rate. irregular  MS:   All extremities are intact.    Neurologic:   Awake, alert        Data Reviewed:   I have personally reviewed following labs and imaging studies:  Labs: Labs show the  following:   Basic Metabolic Panel: Recent Labs  Lab 09/11/2020 1402 09/13/2020 1405 09/04/2020 2123 09/02/20 0448 09/03/20 0448  NA 133*  --  131* 128* 134*  K 2.2*  --  2.5* 3.2* 3.4*  CL 83*  --  83* 82* 95*  CO2 35*  --  35* 34* 31  GLUCOSE 111*  --  116* 112* 83  BUN 27*  --  25* 29* 31*  CREATININE 0.97  --  0.92 0.90 0.84  CALCIUM 10.6*  --  10.6* 10.4* 10.2  MG  --  1.5*  --  2.4  --    GFR Estimated Creatinine Clearance: 53.2 mL/min (by C-G formula based on SCr of 0.84 mg/dL). Liver Function Tests: Recent Labs  Lab 09/08/2020 1405 09/03/20 0448  AST 42* 28  ALT 22 16  ALKPHOS 94 55  BILITOT 2.7* 2.2*  PROT 7.1 5.8*  ALBUMIN 3.6 2.8*   Recent Labs  Lab 08/16/2020 1405  LIPASE 39   No results for input(s): AMMONIA in the last 168 hours. Coagulation profile No results for input(s): INR, PROTIME in the last 168 hours.  CBC: Recent Labs  Lab 08/19/2020 1402 09/02/20 0448 09/03/20 0448  WBC 9.7 8.2 4.2  HGB 14.2 13.9 14.0  HCT 39.8 40.1 40.8  MCV 92.8 93.5 95.8  PLT 111* 99* 80*   Cardiac Enzymes: No results for input(s): CKTOTAL, CKMB, CKMBINDEX, TROPONINI in the last 168 hours. BNP (last 3 results) No results for input(s): PROBNP in the last 8760 hours. CBG: No results for input(s): GLUCAP in the last 168 hours. D-Dimer: Recent Labs    08/28/2020 1405  DDIMER 3.22*   Hgb A1c: No results for input(s): HGBA1C in the last 72 hours. Lipid Profile: No results for input(s): CHOL, HDL, LDLCALC, TRIG, CHOLHDL, LDLDIRECT in the last 72 hours. Thyroid function studies: No results for input(s): TSH, T4TOTAL, T3FREE, THYROIDAB in the last 72 hours.  Invalid input(s): FREET3 Anemia work up: No results for input(s): VITAMINB12, FOLATE, FERRITIN, TIBC, IRON, RETICCTPCT in the last 72 hours. Sepsis Labs: Recent Labs  Lab 09/03/2020 1402 08/23/2020 1456 09/12/2020 1626 09/02/20 0448 09/03/20 0448  WBC 9.7  --   --  8.2 4.2  LATICACIDVEN  --  2.8* 2.5*  --   --      Microbiology Recent Results (from the past 240 hour(s))  Resp Panel by RT-PCR (Flu A&B, Covid) Nasopharyngeal Swab     Status: None   Collection Time: 08/28/2020  2:56 PM   Specimen: Nasopharyngeal Swab; Nasopharyngeal(NP) swabs in vial transport medium  Result Value Ref Range Status   SARS Coronavirus 2 by RT PCR NEGATIVE  NEGATIVE Final    Comment: (NOTE) SARS-CoV-2 target nucleic acids are NOT DETECTED.  The SARS-CoV-2 RNA is generally detectable in upper respiratory specimens during the acute phase of infection. The lowest concentration of SARS-CoV-2 viral copies this assay can detect is 138 copies/mL. A negative result does not preclude SARS-Cov-2 infection and should not be used as the sole basis for treatment or other patient management decisions. A negative result may occur with  improper specimen collection/handling, submission of specimen other than nasopharyngeal swab, presence of viral mutation(s) within the areas targeted by this assay, and inadequate number of viral copies(<138 copies/mL). A negative result must be combined with clinical observations, patient history, and epidemiological information. The expected result is Negative.  Fact Sheet for Patients:  EntrepreneurPulse.com.au  Fact Sheet for Healthcare Providers:  IncredibleEmployment.be  This test is no t yet approved or cleared by the [place] FDA and  has been authorized for detection and/or diagnosis of SARS-CoV-2 by FDA under an Emergency Use Authorization (EUA). This EUA will remain  in effect (meaning this test can be used) for the duration of the COVID-19 declaration under Section 564(b)(1) of the Act, 21 U.S.C.section 360bbb-3(b)(1), unless the authorization is terminated  or revoked sooner.       Influenza A by PCR NEGATIVE NEGATIVE Final   Influenza B by PCR NEGATIVE NEGATIVE Final    Comment: (NOTE) The Xpert Xpress SARS-CoV-2/FLU/RSV plus assay is  intended as an aid in the diagnosis of influenza from Nasopharyngeal swab specimens and should not be used as a sole basis for treatment. Nasal washings and aspirates are unacceptable for Xpert Xpress SARS-CoV-2/FLU/RSV testing.  Fact Sheet for Patients: EntrepreneurPulse.com.au  Fact Sheet for Healthcare Providers: IncredibleEmployment.be  This test is not yet approved or cleared by the [place] FDA and has been authorized for detection and/or diagnosis of SARS-CoV-2 by FDA under an Emergency Use Authorization (EUA). This EUA will remain in effect (meaning this test can be used) for the duration of the COVID-19 declaration under Section 564(b)(1) of the Act, 21 U.S.C. section 360bbb-3(b)(1), unless the authorization is terminated or revoked.  Performed at [facility], [address] [place], [place] [address]   Blood culture (routine x 2)     Status: None (Preliminary result)   Collection Time: 08/31/2020  4:13 PM   Specimen: BLOOD  Result Value Ref Range Status   Specimen Description BLOOD RIGHT HAND  Final   Special Requests   Final    BOTTLES DRAWN AEROBIC AND ANAEROBIC Blood Culture results may not be optimal due to an inadequate volume of blood received in culture bottles   Culture   Final    NO GROWTH 2 DAYS Performed at [facility], [address] [place], [place] [address]    Report Status PENDING  Incomplete  Blood culture (routine x 2)     Status: None (Preliminary result)   Collection Time: 08/31/2020  4:13 PM   Specimen: BLOOD  Result Value Ref Range Status   Specimen Description BLOOD  RAC  Final   Special Requests   Final    BOTTLES DRAWN AEROBIC AND ANAEROBIC Blood Culture results may not be optimal due to an excessive volume of blood received in culture bottles   Culture   Final    NO GROWTH 2 DAYS Performed at [facility], [address], [place], [place] [address]    Report Status  PENDING  Incomplete    Procedures and diagnostic studies:  DG Chest  2 View  Result Date: 08/23/2020 CLINICAL DATA:  Left chest pain. EXAM: CHEST - 2 VIEW COMPARISON:  06/02/2019 FINDINGS: Cardiac enlargement. Normal vascularity. Atherosclerotic calcification aorta Patchy infiltrate in the right mid and lower lung zone, not present previously. Suspicious for pneumonia. No significant pleural effusion. IMPRESSION: Right lower lobe infiltrate, probable pneumonia Electronically Signed   By: Franchot Gallo M.D.   On: 08/22/2020 14:56   CT Angio Chest PE W and/or Wo Contrast  Result Date: 09/07/2020 CLINICAL DATA:  The left-sided chest pain beginning last night. Vomiting. EXAM: CT ANGIOGRAPHY CHEST WITH CONTRAST TECHNIQUE: Multidetector CT imaging of the chest was performed using the standard protocol during bolus administration of intravenous contrast. Multiplanar CT image reconstructions and MIPs were obtained to evaluate the vascular anatomy. CONTRAST:  28mL OMNIPAQUE IOHEXOL 350 MG/ML SOLN COMPARISON:  CT chest x-ray a 09/07/2020 CT a chest 07/16/2018 FINDINGS: Cardiovascular: Heart is mildly enlarged, stable. Coronary artery calcifications again noted. Right atrium enlarged. Atherosclerotic calcifications are present the aortic arch and great vessel origins without aneurysm or stenosis. Pulmonary artery opacification is excellent. No significant filling defects are present. Pulmonary artery size is normal. Mediastinum/Nodes: No significant mediastinal, hilar, or axillary adenopathy is present. Esophagus is somewhat dilated and fluid-filled. Large distal hiatal hernia is again noted, fluid-filled. Lungs/Pleura: Patchy airspace opacities are present throughout the right lung. Left lung is clear. Airways are patent. No significant pleural disease is present. Upper Abdomen: Upper abdomen is unremarkable. Musculoskeletal: Exaggerated thoracic kyphosis is again seen. Vertebral body heights maintained. Alignment  is anatomic. Review of the MIP images confirms the above findings. IMPRESSION: 1. No pulmonary embolus. 2. Patchy airspace opacities throughout the right lung compatible with multi lobar pneumonia. 3. Stable cardiomegaly without failure. 4. Coronary artery disease. 5. Large distal hiatal hernia, fluid-filled. 6. Exaggerated thoracic kyphosis. 7. Aortic Atherosclerosis (ICD10-I70.0). Electronically Signed   By: San Morelle M.D.   On: 08/19/2020 16:16   DG Abd Portable 1V  Result Date: 09/02/2020 CLINICAL DATA:  87 year old male query ileus. EXAM: PORTABLE ABDOMEN - 1 VIEW COMPARISON:  CT Abdomen and Pelvis 07/16/2018. FINDINGS: Portable AP supine view at 0924 hours. Non obstructed bowel gas pattern. Similar gas pattern to the 2020 CT. Excreted IV contrast in the urinary bladder. No acute osseous abnormality identified. IMPRESSION: Normal bowel gas pattern. Electronically Signed   By: Genevie Ann M.D.   On: 09/02/2020 09:52   US ABDOMEN LIMITED RUQ (LIVER/GB)  Result Date: 09/06/2020 CLINICAL DATA:  Elevated bilirubin EXAM: ULTRASOUND ABDOMEN LIMITED RIGHT UPPER QUADRANT COMPARISON:  07/16/2018 FINDINGS: Gallbladder: No gallstones or wall thickening visualized. No sonographic Murphy sign noted by sonographer. Common bile duct: Diameter: 3.9 mm. Liver: Increased echogenicity with heterogeneity consistent with fatty infiltration. Portal vein is patent on color Doppler imaging with normal direction of blood flow towards the liver. Other: Stomach is distended with fluid. IMPRESSION: Fatty liver.  No acute abnormality noted. Electronically Signed   By: Inez Catalina M.D.   On: 08/31/2020 17:25    Medications:    metoprolol tartrate  2.5 mg Intravenous Q6H   multivitamin with minerals  1 tablet Oral Daily   [START ON 09/06/2020] pantoprazole  40 mg Intravenous Q12H   pneumococcal 23 valent vaccine  0.5 mL Intramuscular Tomorrow-1000   cyanocobalamin  1,000 mcg Oral Daily   Continuous Infusions:  sodium  chloride 75 mL/hr at 09/02/20 1047   azithromycin 500 mg (09/03/20 1112)   cefTRIAXone (ROCEPHIN)  IV 1 g (09/03/20 0944)   pantoprazole 8 mg/hr (  09/03/20 0303)     LOS: 1 day   Geradine Girt  [facility]   How to contact the [facility] Attending or Consulting provider [place] or covering provider during after hours [place], for this patient?  Check the care team in [facility] and look for a) attending/consulting [facility] provider listed and b) the [facility] team listed Log into [email or web address] and use [place]'s universal password to access. If you do not have the password, please contact the hospital operator. Locate the [facility] provider you are looking for under [facility] and page to a number that you can be directly reached. If you still have difficulty reaching the provider, please page the [facility] (Director on Call) for the Hospitalists listed on amion for assistance.  09/03/2020, 12:09 PM

## 2020-09-03 NOTE — Progress Notes (Signed)
   09/03/20 0633  Vitals  Temp 98.2 F (36.8 C)  Temp Source Axillary  BP 98/60  MAP (mmHg) 72  BP Location Left Arm  BP Method Automatic  Patient Position (if appropriate) Lying  Pulse Rate (!) 114  Pulse Rate Source Dinamap   Ordered to give scheduled 5mg  IV metoprolol per Randol Kern, NP.

## 2020-09-04 DIAGNOSIS — R112 Nausea with vomiting, unspecified: Secondary | ICD-10-CM | POA: Diagnosis not present

## 2020-09-04 DIAGNOSIS — I4891 Unspecified atrial fibrillation: Secondary | ICD-10-CM | POA: Diagnosis not present

## 2020-09-04 DIAGNOSIS — J189 Pneumonia, unspecified organism: Secondary | ICD-10-CM | POA: Diagnosis not present

## 2020-09-04 DIAGNOSIS — E876 Hypokalemia: Secondary | ICD-10-CM | POA: Diagnosis not present

## 2020-09-04 MED ORDER — LORAZEPAM 2 MG/ML IJ SOLN
1.0000 mg | INTRAMUSCULAR | Status: DC | PRN
  Administered 2020-09-04: 1 mg via INTRAVENOUS
  Filled 2020-09-04: qty 1

## 2020-09-04 MED ORDER — MELATONIN 5 MG PO TABS
5.0000 mg | ORAL_TABLET | Freq: Every day | ORAL | Status: DC
  Filled 2020-09-04 (×2): qty 1

## 2020-09-04 MED ORDER — SODIUM CHLORIDE 0.9 % IV SOLN
3.0000 g | Freq: Four times a day (QID) | INTRAVENOUS | Status: DC
  Administered 2020-09-04 (×2): 3 g via INTRAVENOUS
  Filled 2020-09-04 (×4): qty 8

## 2020-09-04 MED ORDER — MORPHINE SULFATE (PF) 2 MG/ML IV SOLN
1.0000 mg | INTRAVENOUS | Status: DC | PRN
  Administered 2020-09-04 – 2020-09-05 (×4): 1 mg via INTRAVENOUS
  Filled 2020-09-04 (×4): qty 1

## 2020-09-04 MED ORDER — METOPROLOL TARTRATE 5 MG/5ML IV SOLN
5.0000 mg | Freq: Once | INTRAVENOUS | Status: AC
  Administered 2020-09-04: 5 mg via INTRAVENOUS
  Filled 2020-09-04: qty 5

## 2020-09-04 MED ORDER — GLYCOPYRROLATE 1 MG PO TABS
1.0000 mg | ORAL_TABLET | ORAL | Status: DC | PRN
  Filled 2020-09-04: qty 1

## 2020-09-04 MED ORDER — FUROSEMIDE 10 MG/ML IJ SOLN
40.0000 mg | Freq: Once | INTRAMUSCULAR | Status: AC
  Administered 2020-09-04: 05:00:00 40 mg via INTRAVENOUS
  Filled 2020-09-04: qty 4

## 2020-09-04 MED ORDER — GLYCOPYRROLATE 0.2 MG/ML IJ SOLN: 0.2000 mg | INTRAMUSCULAR | Status: DC | PRN

## 2020-09-04 MED ORDER — GLYCOPYRROLATE 0.2 MG/ML IJ SOLN
0.2000 mg | INTRAMUSCULAR | Status: DC | PRN
  Administered 2020-09-04 – 2020-09-05 (×2): 0.2 mg via INTRAVENOUS
  Filled 2020-09-04 (×2): qty 1

## 2020-09-04 MED ORDER — LORAZEPAM 1 MG PO TABS: 1.0000 mg | ORAL_TABLET | ORAL | Status: DC | PRN

## 2020-09-04 MED ORDER — LORAZEPAM 2 MG/ML PO CONC: 1.0000 mg | ORAL | Status: DC | PRN

## 2020-09-04 NOTE — Progress Notes (Signed)
Pharmacy Antibiotic Note  Dustin Acosta. is a 87 y.o. male admitted on 08/23/2020 with  aspiration pneumonia .  Pharmacy has been consulted for Unasyn dosing.  Plan: Unasyn 3 gm q6h per indication and current renal function.  Pharmacy will continue to follow and adjust abx dosing whenever warranted.  Height: 5\' 8"  (172.7 cm) Weight: 73.5 kg (162 lb 0.6 oz) IBW/kg (Calculated) : 68.4  Temp (24hrs), Avg:98.4 F (36.9 C), Min:98.1 F (36.7 C), Max:98.9 F (37.2 C)  Recent Labs  Lab 08/24/2020 1402 08/20/2020 1456 09/12/2020 1626 08/19/2020 2123 09/02/20 0448 09/03/20 0448  WBC 9.7  --   --   --  8.2 4.2  CREATININE 0.97  --   --  0.92 0.90 0.84  LATICACIDVEN  --  2.8* 2.5*  --   --   --     Estimated Creatinine Clearance: 53.2 mL/min (by C-G formula based on SCr of 0.84 mg/dL).    Allergies  Allergen Reactions   Other Rash and Shortness Of Breath    RED MEAT (BEEF and PORK)    Apixaban Itching and Rash   Warfarin Itching and Rash    Antimicrobials this admission: 6/18 Ceftriaxone >> 6/20 6/18 Azithromycin >> x 3 doses 6/20 Unasyn >>  Microbiology results: 6/17 BCx: Pending  Thank you for allowing pharmacy to be a part of this patient's care.  Renda Rolls, PharmD, [facility] 09/04/2020 7:07 AM

## 2020-09-04 NOTE — Progress Notes (Signed)
Cross Cover Patient with worsening hypoxia tachycardia and tachypnea. Given 5 metoprolol and 40 lasix. IV fluids stopped Discussed with daughter possibility of use of BIPAP to help work of breathing and she said yes.  Patient refused. Daughter informed  Antibiotics changed to Unasyn for likely aspiration PTH added to am labs as despite hydration, calcium remained elevated

## 2020-09-04 NOTE — Progress Notes (Addendum)
Paged on call provider, Sharion Settler, regarding change in patient's vitals. Pt requiring more oxygen due to 02 sats dropping to mid 80's, also heart rate starting to sustain in mid 140's, as well as increased respirations.   Medications (Metoprolol 5mg  and Lasix 40mg ) ordered and given, and provider at bedside. Was instructed by provider to not call a rapid at this time.   Patient had transfer orders put in for step-down to place him on Bipap. However, after provider spoke with patient's daughter and the patient, order's were taken out because he refused to be placed on Bipap and wants no further interventions.  Will continue to monitor.    09/04/20 0440  Assess: MEWS Score  Temp 98.1 F (36.7 C)  BP 124/73  Pulse Rate (!) 144  Resp (!) 35  Level of Consciousness Alert  O2 Device HFNC  O2 Flow Rate (L/min) 13 L/min  Assess: MEWS Score  MEWS Temp 0  MEWS Systolic 0  MEWS Pulse 3  MEWS RR 2  MEWS LOC 0  MEWS Score 5  MEWS Score Color Red  Assess: if the MEWS score is Yellow or Red  Were vital signs taken at a resting state? Yes  Focused Assessment No change from prior assessment  Early Detection of Sepsis Score *See Row Information* Low  MEWS guidelines implemented *See Row Information* Yes  Treat  MEWS Interventions Administered scheduled meds/treatments;Other (Comment)  Pain Scale 0-10  Pain Score 0  Take Vital Signs  Increase Vital Sign Frequency  Red: Q 1hr X 4 then Q 4hr X 4, if remains red, continue Q 4hrs  Escalate  MEWS: Escalate Red: discuss with charge nurse/RN and provider, consider discussing with RRT  Notify: Charge Nurse/RN  Name of Charge Nurse/RN Notified Orvil Feil, RN  Date Charge Nurse/RN Notified 09/04/20  Time Charge Nurse/RN Notified 0441  Notify: Provider  Provider Name/Title Sharion Settler, NP  Date Provider Notified 09/04/20  Time Provider Notified [phone]  Notification Type Page  Notification Reason Change in status (increased o2  requirements and heart rate, tachypenic)  Provider response See new orders;At bedside  Date of Provider Response 09/04/20  Time of Provider Response [phone]  Document  Patient Outcome Stabilized after interventions

## 2020-09-04 NOTE — Progress Notes (Signed)
Progress Note    Dustin Acosta [facility].  RDE:[id number] DOB: 05/01/1924  DOA: 08/29/2020 PCP: Baxter Hire, MD    Brief Narrative:     Medical records reviewed and are as summarized below:  Dustin Sparrow. is an 87 y.o. male with medical history significant for chronic atrial fibrillation, pulmonary hypertension, hypertension, hyperlipidemia, history of CAD status post PCI, last left heart cath was on 08/06/2014 with DES to mid RCA, presents to the emergency department for chief concerns of chest pain.    Assessment/Plan:   Principal Problem:   PNA (pneumonia) Active Problems:   A-fib (HCC)   Bilateral edema of lower extremity   HFrEF (heart failure with reduced ejection fraction) (HCC)   Hyperlipidemia   Peripheral vascular disease (HCC)   Hypokalemia   Gastroenteritis   N&V (nausea and vomiting)   Patient continues to refuse interventions such as NG Tube and Bipap.  Wants to be comfortable and have strawberry jello.  Discussed with HCPA at bedside and she is in agreement for comfort care  Acute respiratory failure -O2 as needed for comfort  Nausea/vomiting -he is still refusing NG tube placement-- U/s and CT showed fluid filled stomach -PRN meds  Multilobar pneumonia in the right lung Meets sepsis criteria with elevated heart rate, increased lactic acid, respiration rate, source of infection being pneumonia - comfort focused care  Hypomagnesemia/hypokalemia -repleted   History of coronary artery disease status post DES to mid RCA - Left heart cath in 08/06/2014 -comfort focused care   Atrial fibrillation with RVR-not on anticoagulation due to increased risk of bleeding -IV BB until able to take PO   Hyperlipidemia -statin when able to take PO   Insomnia-melatonin 5 mg nightly.  For sleep  Elevated calcium- due to dehydration -IVF    Family Communication/Anticipated D/C date and plan/Code Status   Code Status: DNR  Disposition Plan:  Status is: inpt  The patient will require care spanning > 2 midnights and should be moved to inpatient because: Inpatient level of care appropriate due to severity of illness  Dispo: The patient is from: Home              Anticipated d/c is to: Home              Patient currently is not medically stable to d/c.- comfort care   Difficult to place patient No         Medical Consultants:   GI Palliative care   Subjective:   Patient continues to refuse interventions such as bipap and NG Tube-- wants to be comfortable  Objective:    Vitals:   09/04/20 0649 09/04/20 0653 09/04/20 0751 09/04/20 1159  BP:   112/65 112/66  Pulse:   (!) 124 (!) 101  Resp:   18 18  Temp:   97.6 F (36.4 C) 97.8 F (36.6 C)  TempSrc:      SpO2: (!) 87% (!) 88% 96% (!) 87%  Weight:      Height:        Intake/Output Summary (Last 24 hours) at 09/04/2020 1221 Last data filed at 09/04/2020 6314 Gross per 24 hour  Intake 2438.76 ml  Output 550 ml  Net 1888.76 ml   Filed Weights   09/11/2020 1400 09/14/2020 2100  Weight: 73.5 kg 73.5 kg    Exam:  General: Appearance:    Elderly/ill appearing male who appears uncomfortable         Heart:  Tachycardic. Irregular   MS:   All extremities are intact.    Neurologic:   Awake, speech difficult to understand         Data Reviewed:   I have personally reviewed following labs and imaging studies:  Labs: Labs show the following:   Basic Metabolic Panel: Recent Labs  Lab 09/04/2020 1402 09/06/2020 1405 09/02/2020 2123 09/02/20 0448 09/03/20 0448  NA 133*  --  131* 128* 134*  K 2.2*  --  2.5* 3.2* 3.4*  CL 83*  --  83* 82* 95*  CO2 35*  --  35* 34* 31  GLUCOSE 111*  --  116* 112* 83  BUN 27*  --  25* 29* 31*  CREATININE 0.97  --  0.92 0.90 0.84  CALCIUM 10.6*  --  10.6* 10.4* 10.2  MG  --  1.5*  --  2.4  --    GFR Estimated Creatinine Clearance: 53.2 mL/min (by C-G formula based on SCr of 0.84 mg/dL). Liver Function Tests: Recent  Labs  Lab 09/09/2020 1405 09/03/20 0448  AST 42* 28  ALT 22 16  ALKPHOS 94 55  BILITOT 2.7* 2.2*  PROT 7.1 5.8*  ALBUMIN 3.6 2.8*   Recent Labs  Lab 08/31/2020 1405  LIPASE 39   No results for input(s): AMMONIA in the last 168 hours. Coagulation profile No results for input(s): INR, PROTIME in the last 168 hours.  CBC: Recent Labs  Lab 09/14/2020 1402 09/02/20 0448 09/03/20 0448  WBC 9.7 8.2 4.2  HGB 14.2 13.9 14.0  HCT 39.8 40.1 40.8  MCV 92.8 93.5 95.8  PLT 111* 99* 80*   Cardiac Enzymes: No results for input(s): CKTOTAL, CKMB, CKMBINDEX, TROPONINI in the last 168 hours. BNP (last 3 results) No results for input(s): PROBNP in the last 8760 hours. CBG: No results for input(s): GLUCAP in the last 168 hours. D-Dimer: Recent Labs    08/23/2020 1405  DDIMER 3.22*   Hgb A1c: No results for input(s): HGBA1C in the last 72 hours. Lipid Profile: No results for input(s): CHOL, HDL, LDLCALC, TRIG, CHOLHDL, LDLDIRECT in the last 72 hours. Thyroid function studies: No results for input(s): TSH, T4TOTAL, T3FREE, THYROIDAB in the last 72 hours.  Invalid input(s): FREET3 Anemia work up: No results for input(s): VITAMINB12, FOLATE, FERRITIN, TIBC, IRON, RETICCTPCT in the last 72 hours. Sepsis Labs: Recent Labs  Lab 09/12/2020 1402 08/29/2020 1456 08/25/2020 1626 09/02/20 0448 09/03/20 0448  WBC 9.7  --   --  8.2 4.2  LATICACIDVEN  --  2.8* 2.5*  --   --     Microbiology Recent Results (from the past 240 hour(s))  Resp Panel by RT-PCR (Flu A&B, Covid) Nasopharyngeal Swab     Status: None   Collection Time: 09/04/2020  2:56 PM   Specimen: Nasopharyngeal Swab; Nasopharyngeal(NP) swabs in vial transport medium  Result Value Ref Range Status   SARS Coronavirus 2 by RT PCR NEGATIVE NEGATIVE Final    Comment: (NOTE) SARS-CoV-2 target nucleic acids are NOT DETECTED.  The SARS-CoV-2 RNA is generally detectable in upper respiratory specimens during the acute phase of infection.  The lowest concentration of SARS-CoV-2 viral copies this assay can detect is 138 copies/mL. A negative result does not preclude SARS-Cov-2 infection and should not be used as the sole basis for treatment or other patient management decisions. A negative result may occur with  improper specimen collection/handling, submission of specimen other than nasopharyngeal swab, presence of viral mutation(s) within the areas targeted by this assay,  and inadequate number of viral copies(<138 copies/mL). A negative result must be combined with clinical observations, patient history, and epidemiological information. The expected result is Negative.  Fact Sheet for Patients:  EntrepreneurPulse.com.au  Fact Sheet for Healthcare Providers:  IncredibleEmployment.be  This test is no t yet approved or cleared by the [place] FDA and  has been authorized for detection and/or diagnosis of SARS-CoV-2 by FDA under an Emergency Use Authorization (EUA). This EUA will remain  in effect (meaning this test can be used) for the duration of the COVID-19 declaration under Section 564(b)(1) of the Act, 21 U.S.C.section 360bbb-3(b)(1), unless the authorization is terminated  or revoked sooner.       Influenza A by PCR NEGATIVE NEGATIVE Final   Influenza B by PCR NEGATIVE NEGATIVE Final    Comment: (NOTE) The Xpert Xpress SARS-CoV-2/FLU/RSV plus assay is intended as an aid in the diagnosis of influenza from Nasopharyngeal swab specimens and should not be used as a sole basis for treatment. Nasal washings and aspirates are unacceptable for Xpert Xpress SARS-CoV-2/FLU/RSV testing.  Fact Sheet for Patients: EntrepreneurPulse.com.au  Fact Sheet for Healthcare Providers: IncredibleEmployment.be  This test is not yet approved or cleared by the [place] FDA and has been authorized for detection and/or diagnosis of SARS-CoV-2 by FDA  under an Emergency Use Authorization (EUA). This EUA will remain in effect (meaning this test can be used) for the duration of the COVID-19 declaration under Section 564(b)(1) of the Act, 21 U.S.C. section 360bbb-3(b)(1), unless the authorization is terminated or revoked.  Performed at [facility], [address] [place], [place] [address]   Blood culture (routine x 2)     Status: None (Preliminary result)   Collection Time: 09/03/2020  4:13 PM   Specimen: BLOOD  Result Value Ref Range Status   Specimen Description BLOOD RIGHT HAND  Final   Special Requests   Final    BOTTLES DRAWN AEROBIC AND ANAEROBIC Blood Culture results may not be optimal due to an inadequate volume of blood received in culture bottles   Culture   Final    NO GROWTH 2 DAYS Performed at [facility], [address] [place], [place] [address]    Report Status PENDING  Incomplete  Blood culture (routine x 2)     Status: None (Preliminary result)   Collection Time: 08/26/2020  4:13 PM   Specimen: BLOOD  Result Value Ref Range Status   Specimen Description BLOOD  RAC  Final   Special Requests   Final    BOTTLES DRAWN AEROBIC AND ANAEROBIC Blood Culture results may not be optimal due to an excessive volume of blood received in culture bottles   Culture   Final    NO GROWTH 2 DAYS Performed at [facility], [address] [place], [place] [address]    Report Status PENDING  Incomplete    Procedures and diagnostic studies:  No results found.  Medications:    melatonin  5 mg Oral QHS   metoprolol tartrate  2.5 mg Intravenous Q6H   Continuous Infusions:     LOS: 2 days   Geradine Girt  [facility]   How to contact the [facility] Attending or Consulting provider [place] or covering provider during after hours [place], for this patient?  Check the care team in [facility] and look for a) attending/consulting TR[facility] provider listed and b) the [facility] team listed Log into [email or web address]  and use [place]'s universal password to access.  If you do not have the password, please contact the hospital operator. Locate the [facility] provider you are looking for under [facility] and page to a number that you can be directly reached. If you still have difficulty reaching the provider, please page the [facility] (Director on Call) for the Hospitalists listed on amion for assistance.  09/04/2020, 12:21 PM

## 2020-09-05 DIAGNOSIS — Z515 Encounter for palliative care: Secondary | ICD-10-CM

## 2020-09-05 DIAGNOSIS — J189 Pneumonia, unspecified organism: Secondary | ICD-10-CM | POA: Diagnosis not present

## 2020-09-05 LAB — PARATHYROID HORMONE, INTACT (NO CA): PTH: 7 pg/mL — ABNORMAL LOW (ref 15–65)

## 2020-09-05 MED ORDER — GLYCOPYRROLATE 0.2 MG/ML IJ SOLN
0.3000 mg | INTRAMUSCULAR | Status: AC
  Administered 2020-09-05: 14:00:00 0.3 mg via INTRAVENOUS
  Filled 2020-09-05: qty 2

## 2020-09-06 LAB — CULTURE, BLOOD (ROUTINE X 2)
Culture: NO GROWTH
Culture: NO GROWTH

## 2020-09-15 NOTE — Progress Notes (Signed)
[id number] - death certificate #

## 2020-09-15 NOTE — Care Management Important Message (Signed)
Important Message  Patient Details  Name: Dustin Acosta. MRN: [id number] Date of Birth: 1924/05/03   Medicare Important Message Given:  Other (see comment)  Patient was placed on comfort care and out of respect for the patient and family no Important Message from Medicare given.  [name] 09/20/20, 7:25 AM

## 2020-09-15 NOTE — TOC Progression Note (Signed)
Transition of Care (TOC) - Progression Note    Patient Details  Name: Dustin Acosta. MRN: [id number] Date of Birth: 1925/01/06  Transition of Care [facility]) CM/SW Contact  Shelbie Hutching, RN Phone Number: 2020-09-27, 11:42 AM  Clinical Narrative:     Patient has been placed on comfort measures.  MD reports expected hospital death.         Expected Discharge Plan and Services                                                 Social Determinants of Health (SDOH) Interventions    Readmission Risk Interventions No flowsheet data found.

## 2020-09-15 NOTE — Death Summary Note (Addendum)
DEATH SUMMARY   Patient Details  Name: Dustin Acosta. MRN: [id number] DOB: Aug 02, 1924  Admission/Discharge Information   Admit Date:  2020/09/10  Date of Death: Date of Death: 09/14/20  Time of Death: Time of Death: 9  Length of Stay: 3  Referring Physician: Baxter Hire, MD   Reason(s) for Hospitalization  N/V and pna  Diagnoses  Preliminary cause of death:  Secondary Diagnoses (including complications and co-morbidities):  Principal Problem:   PNA (pneumonia) Active Problems:   A-fib ([place])   Bilateral edema of lower extremity   Hyperlipidemia   Peripheral vascular disease (HCC)   Hypokalemia   Gastroenteritis   N&V (nausea and vomiting)   Brief Hospital Course (including significant findings, care, treatment, and services provided and events leading to death)  Dustin Acosta. is a 87 y.o. year old male who has medical history significant for chronic atrial fibrillation, pulmonary hypertension, hypertension, hyperlipidemia, history of CAD status post PCI, last left heart cath was on 08/06/2014 with DES to mid RCA, presents to the emergency department for chief concerns of chest pain and intractable nausea and vomiting.   Found to have a fib with RVR, acute resp failure due to PNA.  Patient refused all care including Bipap, NG tube saying he wanted to die.  GOC discussed with family and on 6/20 patient was made comfort care and died in the hospital.   Family expressed gratitude for his peaceful transition.    Pertinent Labs and Studies  Significant Diagnostic Studies DG Chest 2 View  Result Date: 09-10-2020 CLINICAL DATA:  Left chest pain. EXAM: CHEST - 2 VIEW COMPARISON:  06/02/2019 FINDINGS: Cardiac enlargement. Normal vascularity. Atherosclerotic calcification aorta Patchy infiltrate in the right mid and lower lung zone, not present previously. Suspicious for pneumonia. No significant pleural effusion. IMPRESSION: Right lower lobe infiltrate, probable  pneumonia Electronically Signed   By: Franchot Gallo M.D.   On: Sep 10, 2020 14:56   CT Angio Chest PE W and/or Wo Contrast  Result Date: Sep 10, 2020 CLINICAL DATA:  The left-sided chest pain beginning last night. Vomiting. EXAM: CT ANGIOGRAPHY CHEST WITH CONTRAST TECHNIQUE: Multidetector CT imaging of the chest was performed using the standard protocol during bolus administration of intravenous contrast. Multiplanar CT image reconstructions and MIPs were obtained to evaluate the vascular anatomy. CONTRAST:  76mL OMNIPAQUE IOHEXOL 350 MG/ML SOLN COMPARISON:  CT chest x-ray a 09/10/2020 CT a chest 07/16/2018 FINDINGS: Cardiovascular: Heart is mildly enlarged, stable. Coronary artery calcifications again noted. Right atrium enlarged. Atherosclerotic calcifications are present the aortic arch and great vessel origins without aneurysm or stenosis. Pulmonary artery opacification is excellent. No significant filling defects are present. Pulmonary artery size is normal. Mediastinum/Nodes: No significant mediastinal, hilar, or axillary adenopathy is present. Esophagus is somewhat dilated and fluid-filled. Large distal hiatal hernia is again noted, fluid-filled. Lungs/Pleura: Patchy airspace opacities are present throughout the right lung. Left lung is clear. Airways are patent. No significant pleural disease is present. Upper Abdomen: Upper abdomen is unremarkable. Musculoskeletal: Exaggerated thoracic kyphosis is again seen. Vertebral body heights maintained. Alignment is anatomic. Review of the MIP images confirms the above findings. IMPRESSION: 1. No pulmonary embolus. 2. Patchy airspace opacities throughout the right lung compatible with multi lobar pneumonia. 3. Stable cardiomegaly without failure. 4. Coronary artery disease. 5. Large distal hiatal hernia, fluid-filled. 6. Exaggerated thoracic kyphosis. 7. Aortic Atherosclerosis (ICD10-I70.0). Electronically Signed   By: San Morelle M.D.   On: 10-Sep-2020  16:16   DG Abd Portable 1V  Result Date: 09/02/2020 CLINICAL DATA:  87 year old male query ileus. EXAM: PORTABLE ABDOMEN - 1 VIEW COMPARISON:  CT Abdomen and Pelvis 07/16/2018. FINDINGS: Portable AP supine view at 0924 hours. Non obstructed bowel gas pattern. Similar gas pattern to the 2020 CT. Excreted IV contrast in the urinary bladder. No acute osseous abnormality identified. IMPRESSION: Normal bowel gas pattern. Electronically Signed   By: Genevie Ann M.D.   On: 09/02/2020 09:52   US ABDOMEN LIMITED RUQ (LIVER/GB)  Result Date: 08/20/2020 CLINICAL DATA:  Elevated bilirubin EXAM: ULTRASOUND ABDOMEN LIMITED RIGHT UPPER QUADRANT COMPARISON:  07/16/2018 FINDINGS: Gallbladder: No gallstones or wall thickening visualized. No sonographic Murphy sign noted by sonographer. Common bile duct: Diameter: 3.9 mm. Liver: Increased echogenicity with heterogeneity consistent with fatty infiltration. Portal vein is patent on color Doppler imaging with normal direction of blood flow towards the liver. Other: Stomach is distended with fluid. IMPRESSION: Fatty liver.  No acute abnormality noted. Electronically Signed   By: Inez Catalina M.D.   On: 09/04/2020 17:25    Microbiology Recent Results (from the past 240 hour(s))  Resp Panel by RT-PCR (Flu A&B, Covid) Nasopharyngeal Swab     Status: None   Collection Time: 08/16/2020  2:56 PM   Specimen: Nasopharyngeal Swab; Nasopharyngeal(NP) swabs in vial transport medium  Result Value Ref Range Status   SARS Coronavirus 2 by RT PCR NEGATIVE NEGATIVE Final    Comment: (NOTE) SARS-CoV-2 target nucleic acids are NOT DETECTED.  The SARS-CoV-2 RNA is generally detectable in upper respiratory specimens during the acute phase of infection. The lowest concentration of SARS-CoV-2 viral copies this assay can detect is 138 copies/mL. A negative result does not preclude SARS-Cov-2 infection and should not be used as the sole basis for treatment or other patient management  decisions. A negative result may occur with  improper specimen collection/handling, submission of specimen other than nasopharyngeal swab, presence of viral mutation(s) within the areas targeted by this assay, and inadequate number of viral copies(<138 copies/mL). A negative result must be combined with clinical observations, patient history, and epidemiological information. The expected result is Negative.  Fact Sheet for Patients:  EntrepreneurPulse.com.au  Fact Sheet for Healthcare Providers:  IncredibleEmployment.be  This test is no t yet approved or cleared by the [place] FDA and  has been authorized for detection and/or diagnosis of SARS-CoV-2 by FDA under an Emergency Use Authorization (EUA). This EUA will remain  in effect (meaning this test can be used) for the duration of the COVID-19 declaration under Section 564(b)(1) of the Act, 21 U.S.C.section 360bbb-3(b)(1), unless the authorization is terminated  or revoked sooner.       Influenza A by PCR NEGATIVE NEGATIVE Final   Influenza B by PCR NEGATIVE NEGATIVE Final    Comment: (NOTE) The Xpert Xpress SARS-CoV-2/FLU/RSV plus assay is intended as an aid in the diagnosis of influenza from Nasopharyngeal swab specimens and should not be used as a sole basis for treatment. Nasal washings and aspirates are unacceptable for Xpert Xpress SARS-CoV-2/FLU/RSV testing.  Fact Sheet for Patients: EntrepreneurPulse.com.au  Fact Sheet for Healthcare Providers: IncredibleEmployment.be  This test is not yet approved or cleared by the [place] FDA and has been authorized for detection and/or diagnosis of SARS-CoV-2 by FDA under an Emergency Use Authorization (EUA). This EUA will remain in effect (meaning this test can be used) for the duration of the COVID-19 declaration under Section 564(b)(1) of the Act, 21 U.S.C. section 360bbb-3(b)(1), unless the  authorization is terminated or revoked.  Performed  at [facility], [address] [place], [place] [address]   Blood culture (routine x 2)     Status: None (Preliminary result)   Collection Time: 09/11/2020  4:13 PM   Specimen: BLOOD  Result Value Ref Range Status   Specimen Description BLOOD RIGHT HAND  Final   Special Requests   Final    BOTTLES DRAWN AEROBIC AND ANAEROBIC Blood Culture results may not be optimal due to an inadequate volume of blood received in culture bottles   Culture   Final    NO GROWTH 4 DAYS Performed at [facility], [address] [place], [place] [address]    Report Status PENDING  Incomplete  Blood culture (routine x 2)     Status: None (Preliminary result)   Collection Time: 09/06/2020  4:13 PM   Specimen: BLOOD  Result Value Ref Range Status   Specimen Description BLOOD  RAC  Final   Special Requests   Final    BOTTLES DRAWN AEROBIC AND ANAEROBIC Blood Culture results may not be optimal due to an excessive volume of blood received in culture bottles   Culture   Final    NO GROWTH 4 DAYS Performed at [facility], [address] [place], [place] [address]    Report Status PENDING  Incomplete    Lab Basic Metabolic Panel: Recent Labs  Lab 08/29/2020 1402 08/25/2020 1405 09/14/2020 2123 09/02/20 0448 09/03/20 0448  NA 133*  --  131* 128* 134*  K 2.2*  --  2.5* 3.2* 3.4*  CL 83*  --  83* 82* 95*  CO2 35*  --  35* 34* 31  GLUCOSE 111*  --  116* 112* 83  BUN 27*  --  25* 29* 31*  CREATININE 0.97  --  0.92 0.90 0.84  CALCIUM 10.6*  --  10.6* 10.4* 10.2  MG  --  1.5*  --  2.4  --    Liver Function Tests: Recent Labs  Lab 08/26/2020 1405 09/03/20 0448  AST 42* 28  ALT 22 16  ALKPHOS 94 55  BILITOT 2.7* 2.2*  PROT 7.1 5.8*  ALBUMIN 3.6 2.8*   Recent Labs  Lab 08/21/2020 1405  LIPASE 39   No results for input(s): AMMONIA in the last 168 hours. CBC: Recent Labs  Lab 08/18/2020 1402 09/02/20 0448  09/03/20 0448  WBC 9.7 8.2 4.2  HGB 14.2 13.9 14.0  HCT 39.8 40.1 40.8  MCV 92.8 93.5 95.8  PLT 111* 99* 80*   Cardiac Enzymes: No results for input(s): CKTOTAL, CKMB, CKMBINDEX, TROPONINI in the last 168 hours. Sepsis Labs: Recent Labs  Lab 08/29/2020 1402 08/22/2020 1456 08/21/2020 1626 09/02/20 0448 09/03/20 0448  WBC 9.7  --   --  8.2 4.2  LATICACIDVEN  --  2.8* 2.5*  --   --        Geradine Girt 09-26-2020, 3:28 PM

## 2020-09-15 NOTE — Progress Notes (Addendum)
Progress Note    Dustin Acosta [facility].  BLT:[id number] DOB: [birth year]  DOA: 08/16/2020 PCP: Baxter Hire, MD    Brief Narrative:     Medical records reviewed and are as summarized below:  Dustin Acosta. is an 87 y.o. male with medical history significant for chronic atrial fibrillation, pulmonary hypertension, hypertension, hyperlipidemia, history of CAD status post PCI, last left heart cath was on 08/06/2014 with DES to mid RCA, presents to the emergency department for chief concerns of chest pain and intractable nausea and vomiting.   Patient refused all care including Bipap, NG tube saying he wanted to die.  GOC discussed with family and on 6/20 patient was made comfort care.  Suspect in hospital death.      Assessment/Plan:   Principal Problem:   PNA (pneumonia) Active Problems:   A-fib (HCC)   Bilateral edema of lower extremity   Hyperlipidemia   Peripheral vascular disease (HCC)   Hypokalemia   Gastroenteritis   N&V (nausea and vomiting)   Patient continues to refuse interventions such as NG Tube and Bipap.  Wants to be comfortable and have strawberry jello.   -transitioned to comfort care  Acute respiratory failure -O2 as needed for comfort  Nausea/vomiting -he is still refusing NG tube placement-- U/s and CT showed fluid filled stomach -PRN meds -GI signed off  Multilobar pneumonia in the right lung Meets sepsis criteria with elevated heart rate, increased lactic acid, respiration rate, source of infection being pneumonia - comfort focused care  Hypomagnesemia/hypokalemia -repleted   History of coronary artery disease status post DES to mid RCA - Left heart cath in 08/06/2014 - comfort focused care   Atrial fibrillation with RVR-not on anticoagulation due to increased risk of bleeding -IV BB until able to take PO   Hyperlipidemia -statin when able to take PO   Insomnia-melatonin 5 mg nightly.  For sleep  Elevated calcium- due to  dehydration -PTH pending but now comfort focus    Family Communication/Anticipated D/C date and plan/Code Status   Code Status: DNR  Disposition Plan: Status is: inpt  The patient will require care spanning > 2 midnights and should be moved to inpatient because: Inpatient level of care appropriate due to severity of illness  Dispo: The patient is from: Home              Anticipated d/c is to: Home              Patient currently is not medically stable to d/c.- comfort care   Difficult to place patient No    Medical Consultants:   GI Palliative care   Subjective:   Unresponsive   Objective:    Vitals:   09/04/20 0751 09/04/20 1159 09/04/20 2355 09-15-2020 0558  BP: 112/65 112/66 126/80 108/61  Pulse: (!) 124 (!) 101 (!) 137 89  Resp: 18 18  (!) 24  Temp: 97.6 F (36.4 C) 97.8 F (36.6 C)  98.1 F (36.7 C)  TempSrc:    Oral  SpO2: 96% (!) 87% (!) 89% (!) 75%  Weight:      Height:        Intake/Output Summary (Last 24 hours) at 2020/09/15 1400 Last data filed at 09-15-2020 0035 Gross per 24 hour  Intake 20.06 ml  Output 600 ml  Net -579.94 ml   Filed Weights   09/03/2020 1400 09/03/2020 2100  Weight: 73.5 kg 73.5 kg    Exam:  General: Appearance:  Elderly male , not responsive to voice or touch     Lungs:     On [place], diminished  Heart:    Normal heart rate. irregular  MS:   All extremities are intact.    Neurologic:   not responsive to voice and touch     Data Reviewed:   I have personally reviewed following labs and imaging studies:  Labs: Labs show the following:   Basic Metabolic Panel: Recent Labs  Lab 08/28/2020 1402 08/25/2020 1405 09/07/2020 2123 09/02/20 0448 09/03/20 0448  NA 133*  --  131* 128* 134*  K 2.2*  --  2.5* 3.2* 3.4*  CL 83*  --  83* 82* 95*  CO2 35*  --  35* 34* 31  GLUCOSE 111*  --  116* 112* 83  BUN 27*  --  25* 29* 31*  CREATININE 0.97  --  0.92 0.90 0.84  CALCIUM 10.6*  --  10.6* 10.4* 10.2  MG  --  1.5*  --  2.4   --    GFR Estimated Creatinine Clearance: 53.2 mL/min (by C-G formula based on SCr of 0.84 mg/dL). Liver Function Tests: Recent Labs  Lab 08/19/2020 1405 09/03/20 0448  AST 42* 28  ALT 22 16  ALKPHOS 94 55  BILITOT 2.7* 2.2*  PROT 7.1 5.8*  ALBUMIN 3.6 2.8*   Recent Labs  Lab 08/23/2020 1405  LIPASE 39   No results for input(s): AMMONIA in the last 168 hours. Coagulation profile No results for input(s): INR, PROTIME in the last 168 hours.  CBC: Recent Labs  Lab 09/04/2020 1402 09/02/20 0448 09/03/20 0448  WBC 9.7 8.2 4.2  HGB 14.2 13.9 14.0  HCT 39.8 40.1 40.8  MCV 92.8 93.5 95.8  PLT 111* 99* 80*   Cardiac Enzymes: No results for input(s): CKTOTAL, CKMB, CKMBINDEX, TROPONINI in the last 168 hours. BNP (last 3 results) No results for input(s): PROBNP in the last 8760 hours. CBG: No results for input(s): GLUCAP in the last 168 hours. D-Dimer: No results for input(s): DDIMER in the last 72 hours.  Hgb A1c: No results for input(s): HGBA1C in the last 72 hours. Lipid Profile: No results for input(s): CHOL, HDL, LDLCALC, TRIG, CHOLHDL, LDLDIRECT in the last 72 hours. Thyroid function studies: No results for input(s): TSH, T4TOTAL, T3FREE, THYROIDAB in the last 72 hours.  Invalid input(s): FREET3 Anemia work up: No results for input(s): VITAMINB12, FOLATE, FERRITIN, TIBC, IRON, RETICCTPCT in the last 72 hours. Sepsis Labs: Recent Labs  Lab 08/29/2020 1402 09/14/2020 1456 09/02/2020 1626 09/02/20 0448 09/03/20 0448  WBC 9.7  --   --  8.2 4.2  LATICACIDVEN  --  2.8* 2.5*  --   --     Microbiology Recent Results (from the past 240 hour(s))  Resp Panel by RT-PCR (Flu A&B, Covid) Nasopharyngeal Swab     Status: None   Collection Time: 08/25/2020  2:56 PM   Specimen: Nasopharyngeal Swab; Nasopharyngeal(NP) swabs in vial transport medium  Result Value Ref Range Status   SARS Coronavirus 2 by RT PCR NEGATIVE NEGATIVE Final    Comment: (NOTE) SARS-CoV-2 target nucleic  acids are NOT DETECTED.  The SARS-CoV-2 RNA is generally detectable in upper respiratory specimens during the acute phase of infection. The lowest concentration of SARS-CoV-2 viral copies this assay can detect is 138 copies/mL. A negative result does not preclude SARS-Cov-2 infection and should not be used as the sole basis for treatment or other patient management decisions. A negative result may occur  with  improper specimen collection/handling, submission of specimen other than nasopharyngeal swab, presence of viral mutation(s) within the areas targeted by this assay, and inadequate number of viral copies(<138 copies/mL). A negative result must be combined with clinical observations, patient history, and epidemiological information. The expected result is Negative.  Fact Sheet for Patients:  EntrepreneurPulse.com.au  Fact Sheet for Healthcare Providers:  IncredibleEmployment.be  This test is no t yet approved or cleared by the [place] FDA and  has been authorized for detection and/or diagnosis of SARS-CoV-2 by FDA under an Emergency Use Authorization (EUA). This EUA will remain  in effect (meaning this test can be used) for the duration of the COVID-19 declaration under Section 564(b)(1) of the Act, 21 U.S.C.section 360bbb-3(b)(1), unless the authorization is terminated  or revoked sooner.       Influenza A by PCR NEGATIVE NEGATIVE Final   Influenza B by PCR NEGATIVE NEGATIVE Final    Comment: (NOTE) The Xpert Xpress SARS-CoV-2/FLU/RSV plus assay is intended as an aid in the diagnosis of influenza from Nasopharyngeal swab specimens and should not be used as a sole basis for treatment. Nasal washings and aspirates are unacceptable for Xpert Xpress SARS-CoV-2/FLU/RSV testing.  Fact Sheet for Patients: EntrepreneurPulse.com.au  Fact Sheet for Healthcare Providers: IncredibleEmployment.be  This  test is not yet approved or cleared by the [place] FDA and has been authorized for detection and/or diagnosis of SARS-CoV-2 by FDA under an Emergency Use Authorization (EUA). This EUA will remain in effect (meaning this test can be used) for the duration of the COVID-19 declaration under Section 564(b)(1) of the Act, 21 U.S.C. section 360bbb-3(b)(1), unless the authorization is terminated or revoked.  Performed at [facility], [address] [place], [place] [address]   Blood culture (routine x 2)     Status: None (Preliminary result)   Collection Time: 08/29/2020  4:13 PM   Specimen: BLOOD  Result Value Ref Range Status   Specimen Description BLOOD RIGHT HAND  Final   Special Requests   Final    BOTTLES DRAWN AEROBIC AND ANAEROBIC Blood Culture results may not be optimal due to an inadequate volume of blood received in culture bottles   Culture   Final    NO GROWTH 4 DAYS Performed at [facility], [address] [place], [place] [address]    Report Status PENDING  Incomplete  Blood culture (routine x 2)     Status: None (Preliminary result)   Collection Time: 09/14/2020  4:13 PM   Specimen: BLOOD  Result Value Ref Range Status   Specimen Description BLOOD  RAC  Final   Special Requests   Final    BOTTLES DRAWN AEROBIC AND ANAEROBIC Blood Culture results may not be optimal due to an excessive volume of blood received in culture bottles   Culture   Final    NO GROWTH 4 DAYS Performed at [facility], [address] [place], [place] [address]    Report Status PENDING  Incomplete    Procedures and diagnostic studies:  No results found.  Medications:    melatonin  5 mg Oral QHS   metoprolol tartrate  2.5 mg Intravenous Q6H   Continuous Infusions:     LOS: 3 days   Geradine Girt  [facility]   How to contact the [facility] Attending or Consulting provider [place] or covering provider during after hours [place], for this patient?   Check the care team in [facility] and look  for a) attending/consulting [facility] provider listed and b) the [facility] team listed Log into [email or web address] and use [place]'s universal password to access. If you do not have the password, please contact the hospital operator. Locate the [facility] provider you are looking for under [facility] and page to a number that you can be directly reached. If you still have difficulty reaching the provider, please page the [facility] (Director on Call) for the Hospitalists listed on amion for assistance.  2020-09-12, 2:00 PM

## 2020-09-15 NOTE — Consult Note (Signed)
Consultation Note Date: September 09, 2020   Patient Name: Dustin Acosta.  DOB: 09/30/1924  MRN: [id number]  Age / Sex: 87 y.o., male  PCP: Baxter Hire, MD Referring Physician: Geradine Girt, DO  Reason for Consultation: Establishing goals of care  HPI/Patient Profile: Dustin Acosta. is an 87 y.o. male with medical history significant for chronic atrial fibrillation, pulmonary hypertension, hypertension, hyperlipidemia, history of CAD status post PCI, last left heart cath was on 08/06/2014 with DES to mid RCA, presents to the emergency department for chief concerns of chest pain and intractable nausea and vomiting.   Patient refused all care including Bipap, NG tube saying he wanted to die.  GOC discussed with family and on 6/20 patient was made comfort care.  Suspect in hospital death.    Clinical Assessment and Goals of Care: Patient is resting in bed with eyes closed and mouth open; daughter at bedside. Respirations are even and unlabored. She states there are 4 children, and 1 of the daughters are coming to switch out with her so someone remains at the bedside. She states the other 2 siblings are at work, and are aware he is in the dying process and could die at any time.   Daughter states patient has lived a good life. She states he was living alone prior to this event. She states he declined quickly, but she feels grateful he did not suffer and discusses how patient's wife, her mother suffered for months before she died.   She states the family understands his status and decided yesterday on comfort care with hospital death; comfort care has already been implemented. She discusses that he stated he was ready to die, and discusses his refusal of an NGT, EGD, and BIPAP. She confirms the family does not want him to suffer, and do not want any further life prolonging care.   We discussed his symptom  management as he appears restless and has a weak, wet, unproductive cough. He makes attempts to try to hold his head up to cough with her assistance, but does not open eyes or speak. She states he has been reaching to his mouth and feels he has a dry mouth and is thirsty. Discussed oral care.  Simple nasal cannula in place with max rate at 15 lpm of O2. Discussed the drying effects and discomfort of O2 at this rate, discussed reducing this rate for his comfort. She discusses concerns for Morphine; discussed the purpose and use of the medication with pain and work of breathing. She is now currently amenable to Morphine administration. Discussed use of Robinul for secretions. Discussed use of Ativan for anxiety.     SUMMARY OF RECOMMENDATIONS   Anticipated hospital death. Please use current PRN medications for comfort care.    Prognosis:  Hours - Days      Primary Diagnoses: Present on Admission:  PNA (pneumonia)  Hypokalemia  Gastroenteritis  A-fib (HCC)  Bilateral edema of lower extremity  Hyperlipidemia  HFrEF (heart failure with reduced ejection fraction) (  [place])  Peripheral vascular disease ([place])  N&V (nausea and vomiting)   I have reviewed the medical record, interviewed the patient and family, and examined the patient. The following aspects are pertinent.  Past Medical History:  Diagnosis Date   A-fib ([place])    Cancer ([place])    skin / COLON   Coronary artery disease    Dysrhythmia    Edema    FEET/LEGS   Heart murmur    Hypertension    Myocardial infarction ([place])    Stroke ([place])    X 2   Social History   Socioeconomic History   Marital status: Married    Spouse name: Not on file   Number of children: Not on file   Years of education: Not on file   Highest education level: Not on file  Occupational History   Not on file  Tobacco Use   Smoking status: Never   Smokeless tobacco: Never  Substance and Sexual Activity   Alcohol use: No   Drug use: No   Sexual  activity: Not Currently    Birth control/protection: None  Other Topics Concern   Not on file  Social History Narrative   Not on file   Social Determinants of Health   Financial Resource Strain: Not on file  Food Insecurity: Not on file  Transportation Needs: Not on file  Physical Activity: Not on file  Stress: Not on file  Social Connections: Not on file   No family history on file. Scheduled Meds:  glycopyrrolate  0.3 mg Intravenous STAT   melatonin  5 mg Oral QHS   metoprolol tartrate  2.5 mg Intravenous Q6H   Continuous Infusions: PRN Meds:.glycopyrrolate **OR** glycopyrrolate **OR** glycopyrrolate, LORazepam **OR** LORazepam **OR** LORazepam, morphine injection, ondansetron **OR** ondansetron (ZOFRAN) IV, prochlorperazine Medications Prior to Admission:  Prior to Admission medications   Medication Sig Start Date End Date Taking? Authorizing Provider  aspirin EC 81 MG tablet Take 81 mg by mouth daily.   Yes [provider]  cyanocobalamin 1000 MCG tablet Take 1,000 mcg by mouth daily. 09/03/17  Yes [provider]  furosemide (LASIX) 40 MG tablet Take 80 mg by mouth daily. with food 03/09/19  Yes [provider]  metoprolol succinate (TOPROL-XL) 25 MG 24 hr tablet Take 25 mg by mouth daily. 05/30/20  Yes [provider]  Multiple Vitamin (MULTIVITAMIN) tablet Take 1 tablet by mouth daily.   Yes [provider]  potassium chloride SA (K-DUR) 20 MEQ tablet Take 20 mEq by mouth daily. 05/26/18 09/02/20 Yes [provider]  EPINEPHrine 0.3 mg/0.3 mL IJ SOAJ injection Inject 0.3 mLs (0.3 mg total) into the muscle once. Follow package instructions as needed for severe allergy or anaphylactic reaction. 03/28/15   Carrie Mew, MD  nitroGLYCERIN (NITROSTAT) 0.4 MG SL tablet Place 1 tablet (0.4 mg total) under the tongue every 5 (five) minutes x 3 doses as needed for chest pain. 08/09/14   Charolette Forward, MD   Allergies  Allergen  Reactions   Other Rash and Shortness Of Breath    RED MEAT (BEEF and PORK)    Apixaban Itching and Rash   Warfarin Itching and Rash   Review of Systems  Unable to perform ROS  Physical Exam Constitutional:      Comments: Eyes closed. Even and unlabored respirations.     Vital Signs: BP 108/61 (BP Location: Left Arm)   Pulse 89   Temp 98.1 F (36.7 C) (Oral)   Resp (!) 24  Ht 5\' 8"  (1.727 m)   Wt 73.5 kg   SpO2 (!) 75%   BMI 24.64 kg/m  Pain Scale: 0-10   Pain Score: Asleep   SpO2: SpO2: (!) 75 % O2 Device:SpO2: (!) 75 % O2 Flow Rate: .O2 Flow Rate (L/min): 15 L/min  IO: Intake/output summary:  Intake/Output Summary (Last 24 hours) at 09-26-2020 1343 Last data filed at September 26, 2020 0035 Gross per 24 hour  Intake 20.06 ml  Output 600 ml  Net -579.94 ml    LBM: Last BM Date: 08/31/20 Baseline Weight: Weight: 73.5 kg Most recent weight: Weight: 73.5 kg       Time In: 1:20 Time Out: 2:10 Time Total: 50 min Greater than 50%  of this time was spent counseling and coordinating care related to the above assessment and plan.  Signed by: Asencion Gowda, NP   Please contact Palliative Medicine Team phone at [phone] for questions and concerns.  For individual provider: See Shea Evans

## 2020-09-15 DEATH — deceased

## 2020-10-29 IMAGING — US US SCROTUM W/ DOPPLER COMPLETE
1 series · 13 of 25 positions shown · non-contrast
Comparison: CT abdomen pelvis 07/16/2018

CLINICAL DATA: Bilateral scrotal swelling and edema for 3 months

EXAM:
SCROTAL ULTRASOUND
DOPPLER ULTRASOUND OF THE TESTICLES
TECHNIQUE: Complete ultrasound examination of the testicles, epididymis, and
other scrotal structures was performed. Color and spectral Doppler
ultrasound were also utilized to evaluate blood flow to the
testicles.

[Series 1: us scrotum w/ doppler complete · 0.08mm/px · 13 of 54 slices shown]
[im 1/54]
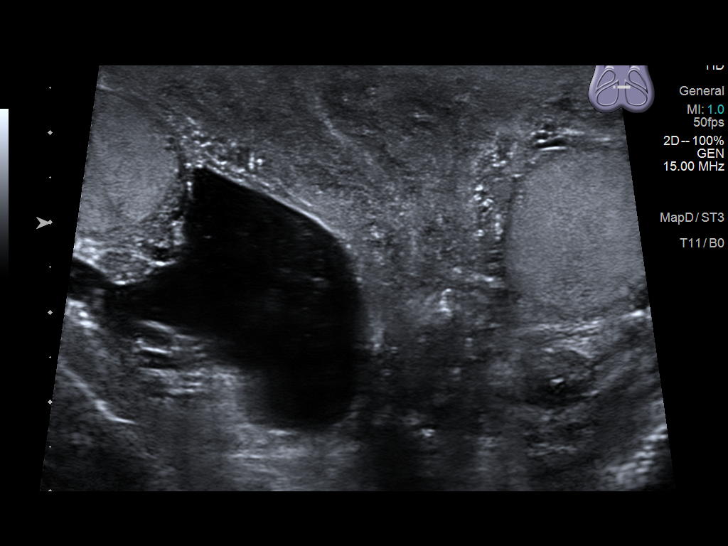
[im 5/54]
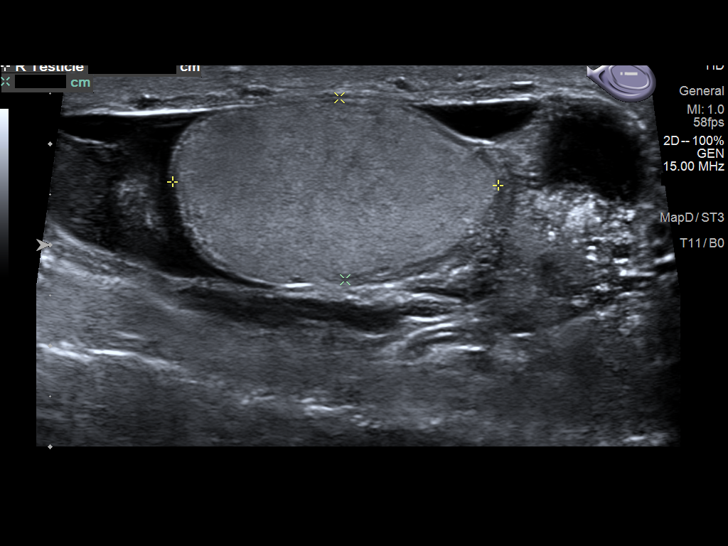
[im 9/54]
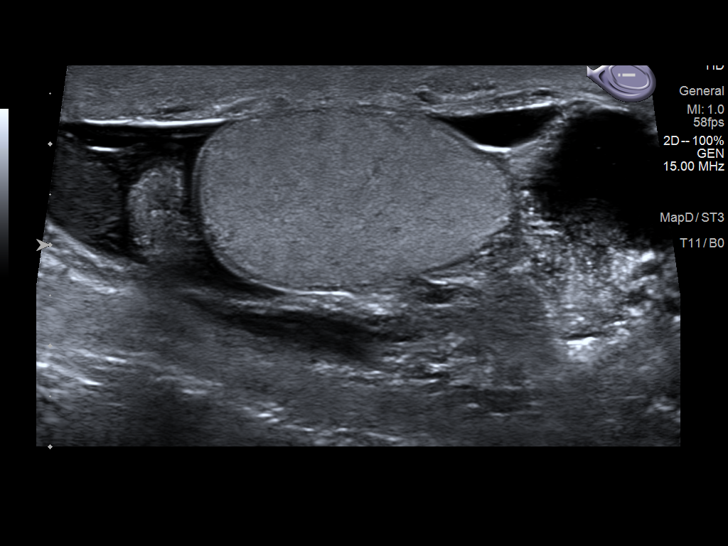
[im 14/54]
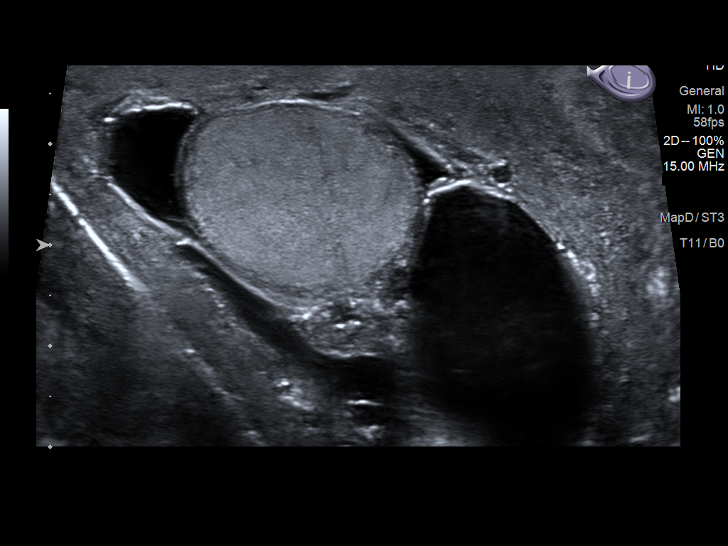
[im 18/54]
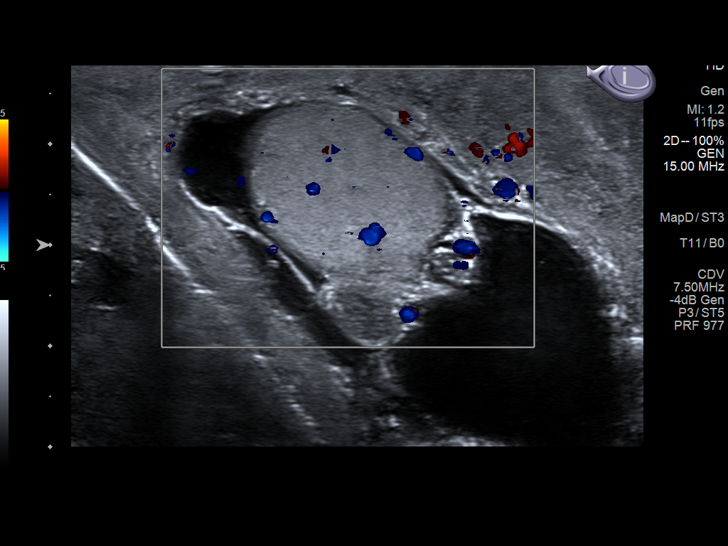
[im 23/54]
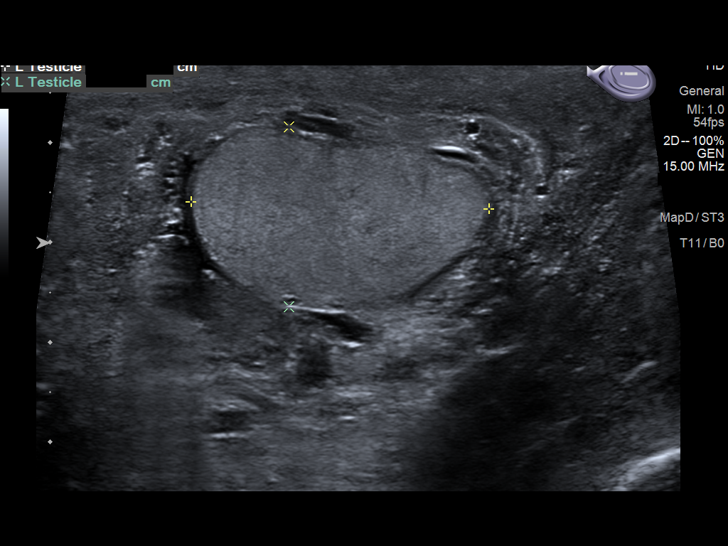
[im 27/54]
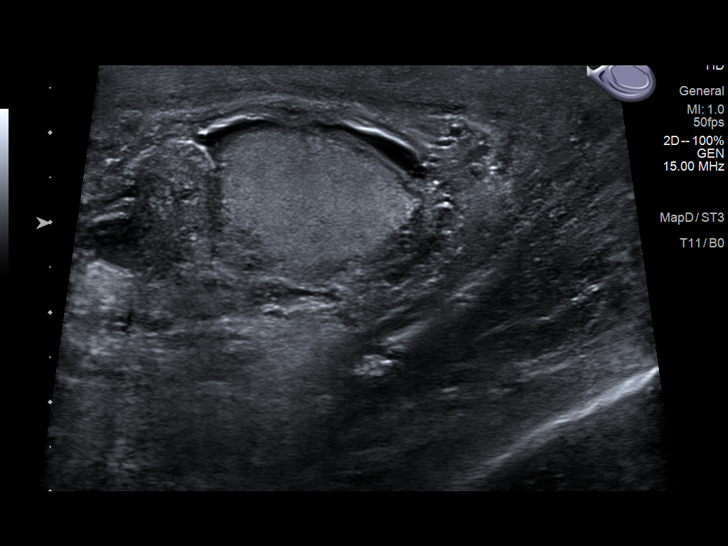
[im 31/54]
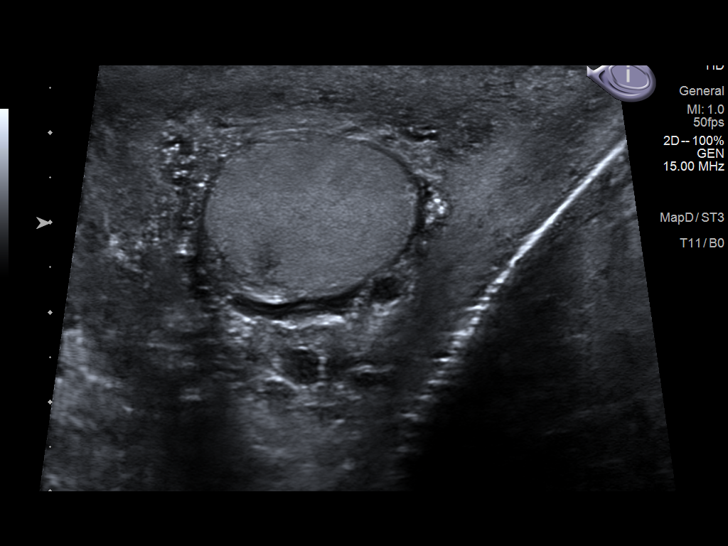
[im 36/54]
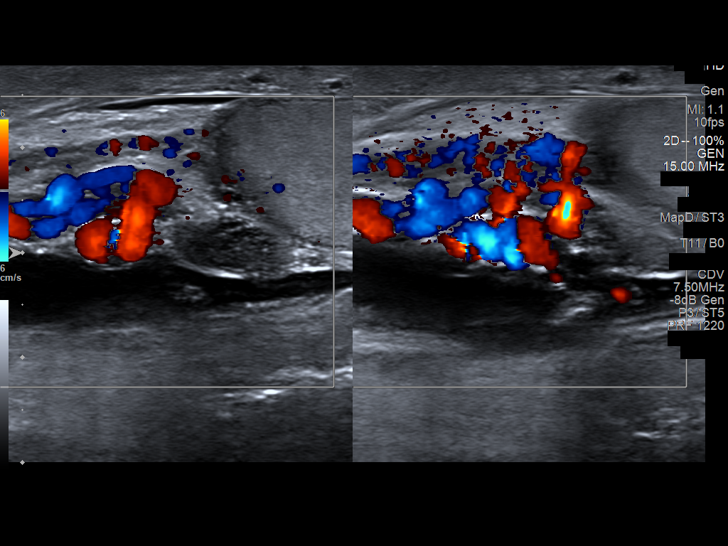
[im 40/54]
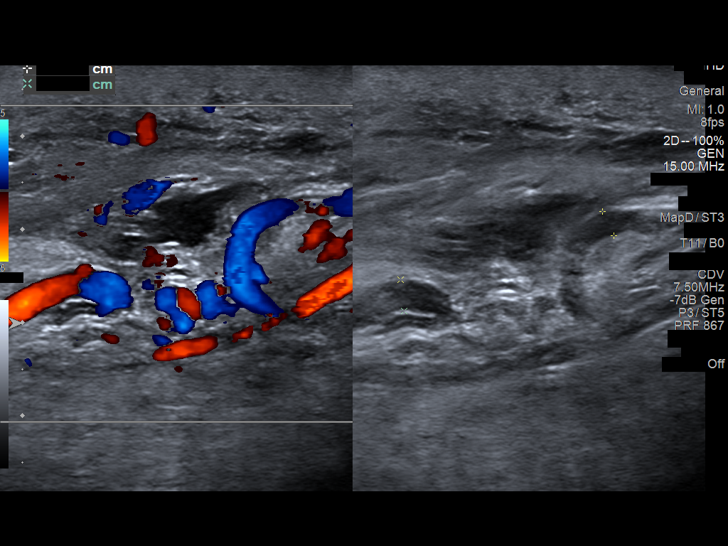
[im 45/54]
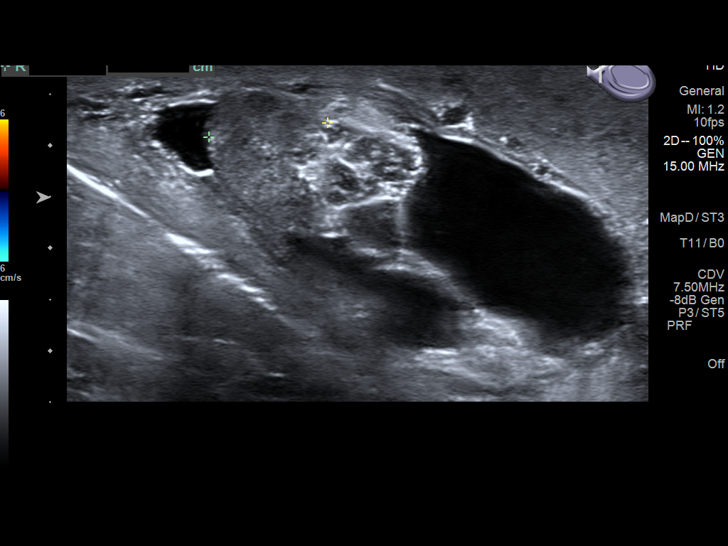
[im 49/54]
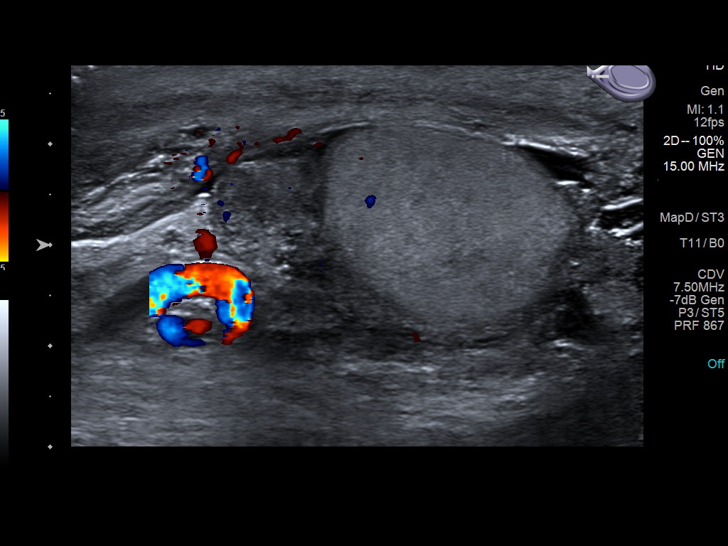
[im 54/54]
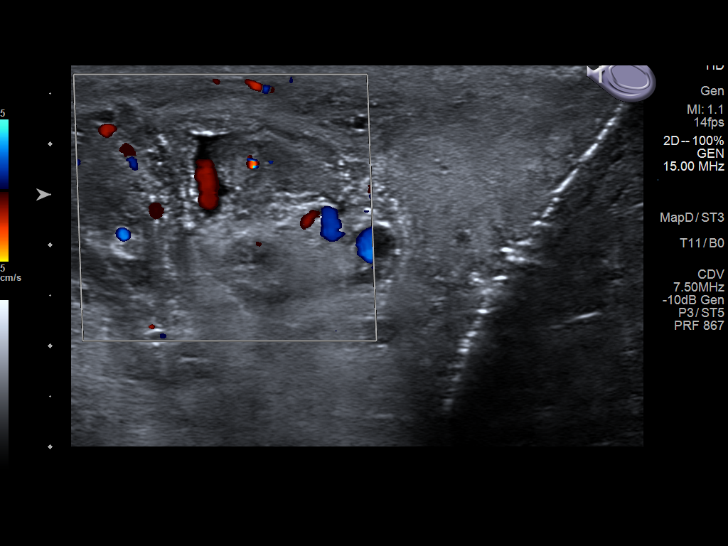

[13 of 25 positions shown; findings below may reference images not displayed]

FINDINGS: Right testicle

Measurements: 3.2 x 1.8 x 2.3 cm. No mass or microlithiasis
visualized.

Left testicle

Measurements: 3.0 x 1.8 x 2.2 cm. No mass or microlithiasis
visualized.

Right epididymis:  Normal in size and appearance.

Left epididymis:  Normal in size and appearance.

Hydrocele: There are bilateral hydroceles, right greater than left.
Fluid content is predominantly anechoic albeit with some punctate
areas of low level internal echogenicity and septations in the right
hydrocele.

Varicocele: Bilateral varicoceles are noted, draining veins
measuring up to 3.6 mm on the right and 3.4 mm on left

Other: Bilateral scrotal skin thickening, left greater than right.

Pulsed Doppler interrogation of both testes demonstrates normal low
resistance arterial and venous waveforms bilaterally.
IMPRESSION: There is bilateral scrotal skin thickening, varicoceles and
hydroceles. Findings can be seen in the setting chronic fluid
overload with such edematous features also present on comparison CT
from July 16, 2018. Minimal complexity of the right hydrocele is
noted with low level internal echoes and septations though these can
be seen with chronicity and a pyocele is less favored but cannot be
fully excluded on sonography.

No evidence of testicular mass, torsion or epididymo-orchitis.
